# Patient Record
Sex: Male | Born: 1945 | Race: White | Hispanic: No | Marital: Married | Smoking: Former smoker
Health system: Southern US, Community
[De-identification: ages and names within clinical notes are randomized; demographics above are authoritative.]

## PROBLEM LIST (undated history)

## (undated) DIAGNOSIS — I639 Cerebral infarction, unspecified: Secondary | ICD-10-CM

## (undated) DIAGNOSIS — I2510 Atherosclerotic heart disease of native coronary artery without angina pectoris: Secondary | ICD-10-CM

## (undated) DIAGNOSIS — E119 Type 2 diabetes mellitus without complications: Secondary | ICD-10-CM

## (undated) DIAGNOSIS — I10 Essential (primary) hypertension: Secondary | ICD-10-CM

## (undated) DIAGNOSIS — E785 Hyperlipidemia, unspecified: Secondary | ICD-10-CM

## (undated) HISTORY — DX: Essential (primary) hypertension: I10

## (undated) HISTORY — DX: Atherosclerotic heart disease of native coronary artery without angina pectoris: I25.10

## (undated) HISTORY — DX: Hyperlipidemia, unspecified: E78.5

## (undated) HISTORY — PX: CORONARY ANGIOPLASTY WITH STENT PLACEMENT: SHX49

## (undated) HISTORY — DX: Type 2 diabetes mellitus without complications: E11.9

---

## 2001-10-24 ENCOUNTER — Ambulatory Visit (HOSPITAL_COMMUNITY): Admission: RE | Admit: 2001-10-24 | Discharge: 2001-10-25 | Payer: Self-pay | Admitting: Cardiology

## 2008-08-22 DIAGNOSIS — I10 Essential (primary) hypertension: Secondary | ICD-10-CM | POA: Insufficient documentation

## 2008-08-22 DIAGNOSIS — E785 Hyperlipidemia, unspecified: Secondary | ICD-10-CM

## 2008-08-22 DIAGNOSIS — E119 Type 2 diabetes mellitus without complications: Secondary | ICD-10-CM | POA: Insufficient documentation

## 2008-08-22 DIAGNOSIS — I2510 Atherosclerotic heart disease of native coronary artery without angina pectoris: Secondary | ICD-10-CM | POA: Insufficient documentation

## 2008-08-26 ENCOUNTER — Ambulatory Visit: Payer: Self-pay | Admitting: Cardiology

## 2008-08-26 DIAGNOSIS — R072 Precordial pain: Secondary | ICD-10-CM

## 2008-08-26 DIAGNOSIS — N529 Male erectile dysfunction, unspecified: Secondary | ICD-10-CM

## 2008-09-04 ENCOUNTER — Telehealth (INDEPENDENT_AMBULATORY_CARE_PROVIDER_SITE_OTHER): Payer: Self-pay

## 2008-09-05 ENCOUNTER — Encounter: Payer: Self-pay | Admitting: Cardiology

## 2008-09-05 ENCOUNTER — Ambulatory Visit: Payer: Self-pay

## 2008-09-09 ENCOUNTER — Ambulatory Visit: Payer: Self-pay | Admitting: Cardiology

## 2008-09-09 ENCOUNTER — Encounter: Payer: Self-pay | Admitting: Cardiology

## 2008-09-09 ENCOUNTER — Ambulatory Visit: Payer: Self-pay

## 2008-09-10 LAB — CONVERTED CEMR LAB
BUN: 15 mg/dL (ref 6–23)
CO2: 30 meq/L (ref 19–32)
GFR calc non Af Amer: 80.45 mL/min (ref >60)
Glucose, Bld: 82 mg/dL (ref 70–99)
Potassium: 3.9 meq/L (ref 3.5–5.1)
Sodium: 144 meq/L (ref 135–145)

## 2008-10-28 ENCOUNTER — Ambulatory Visit: Payer: Self-pay | Admitting: Cardiology

## 2008-10-28 DIAGNOSIS — I428 Other cardiomyopathies: Secondary | ICD-10-CM | POA: Insufficient documentation

## 2008-10-28 DIAGNOSIS — R05 Cough: Secondary | ICD-10-CM

## 2009-08-21 ENCOUNTER — Ambulatory Visit: Payer: Self-pay | Admitting: Cardiology

## 2009-09-16 ENCOUNTER — Telehealth (INDEPENDENT_AMBULATORY_CARE_PROVIDER_SITE_OTHER): Payer: Self-pay | Admitting: *Deleted

## 2009-10-14 ENCOUNTER — Encounter (INDEPENDENT_AMBULATORY_CARE_PROVIDER_SITE_OTHER): Payer: Self-pay | Admitting: *Deleted

## 2010-02-06 ENCOUNTER — Telehealth (INDEPENDENT_AMBULATORY_CARE_PROVIDER_SITE_OTHER): Payer: Self-pay | Admitting: *Deleted

## 2010-03-31 NOTE — Assessment & Plan Note (Signed)
Summary: PER NP CALL FROM [facility]-   Visit Type:  Follow-up Primary Provider:  Rudie Meyer NP  CC:  Cardiomyopathy.  History of Present Illness: The patient is added to the schedule to evaluate episodes of lightheadedness. He saw his company physician last week and was complaining of some lightheadedness. He does describe orthostatic symptomswith lightheadedness if he stands quickly but no syncope. Last week while being examined he was told he had some "irregularities". I don't know of any documented dysrhythmias and he was not feeling palpitations. However, while at work he did feel lightheaded and things "spinning". He had his blood pressure checked soon after reporting these symptoms but says it was "normal". He has not had any shortness of breath, PND or orthopnea. He has not had chest pressure, neck or arm discomfort. He has not had weight gain or edema.  Current Medications (verified): 1)  Metformin Hcl 1000 Mg Tabs (Metformin Hcl) .Marland Kitchen.. 1 By Mouth Daily 2)  Glipizide 10 Mg Tabs (Glipizide) .... Daily 3)  Tricor 145 Mg Tabs (Fenofibrate) .... Daily 4)  Lipitor 40 Mg Tabs (Atorvastatin Calcium) .... Take One Tablet By Mouth Daily. 5)  Indomethacin Cr 75 Mg Cr-Caps (Indomethacin) .... As Needed 6)  Omeprazole 20 Mg Cpdr (Omeprazole) .... Daily 7)  Aspirin 325 Mg  Tabs (Aspirin) .... Daily 8)  Metoprolol Succinate 50 Mg Xr24h-Tab (Metoprolol Succinate) .... Take One Tablet By Mouth Daily 9)  Cozaar 25 Mg Tabs (Losartan Potassium) .... One By Mouth Once Daily 10)  Androderm 5 Mg/24hr Pt24 (Testosterone) .... As Directed 11)  Vitamin D (Ergocalciferol) 50000 Unit Caps (Ergocalciferol) .Marland Kitchen.. 1 By Mouth Weekly 12)  Cyanocobalamin 1000 Mcg/ml Soln (Cyanocobalamin) .... Monthly  Allergies (verified): No Known Drug Allergies  Past History:  Past Medical History: DM (ICD-250.00) x 8 years HYPERTENSION (ICD-401.9) HYPERLIPIDEMIA (ICD-272.4) x years CAD (ICD-414.00) (Cypher  stenting to the Circ and occluded RCA 2003) Cardiomyopathy (EF 45%)    Past Surgical History: Reviewed history from 08/22/2008 and no changes required. NONE  Review of Systems       As stated in the HPI and negative for all other systems.   Vital Signs:  Patient profile:   65 year old male Height:      69 inches Weight:      169 pounds BMI:     25.05 Pulse rate:   102 / minute Pulse (ortho):   123 / minute Resp:     18 per minute BP sitting:   148 / 96  (right arm) BP standing:   124 / 83  Vitals Entered By: Marrion Coy, CNA (August 21, 2009 2:30 PM)  Serial Vital Signs/Assessments:  Time      Position  BP       Pulse  Resp  Temp     By           Lying RA  135/85   100                   Marrion Coy, CNA           Sitting   130/87   113                   [address] CNA           Standing  124/83   123                   [address], CNA 2 [address]  Standing  130/83   112                   [address], CNA 5 Min     Standing  133/81   114                   [address], CNA  Comments: Lying- No complaints Sitting- No complaints Standing-Dizzy  By: Marrion Coy, CNA  2 Min 2 Min Standing- No complaints By: Marrion Coy, CNA  5 Min 5 Min standing- No complaints By: Marrion Coy, CNA    Physical Exam  General:  Well developed, well nourished, in no acute distress. Head:  normocephalic and atraumatic Eyes:  PERRLA/EOM intact; conjunctiva and lids normal. Mouth:  Teeth, gums and palate normal. Oral mucosa normal. Neck:  Neck supple, no JVD. No masses, thyromegaly or abnormal cervical nodes. Chest Wall:  no deformities or breast masses noted Lungs:  Clear bilaterally to auscultation and percussion. Abdomen:  Bowel sounds positive; abdomen soft and non-tender without masses, organomegaly, or hernias noted. No hepatosplenomegaly. Msk:  Back normal, normal gait. Muscle strength and tone normal. Extremities:  No clubbing or cyanosis. Neurologic:  Alert and  oriented x 3. Skin:  Intact without lesions or rashes. Cervical Nodes:  no significant adenopathy Inguinal Nodes:  no significant adenopathy Psych:  Normal affect.   Detailed Cardiovascular Exam  Neck    Carotids: Carotids full and equal bilaterally without bruits.      Neck Veins: Normal, no JVD.    Heart    Inspection: no deformities or lifts noted.      Palpation: normal PMI with no thrills palpable.      Auscultation: regular rate and rhythm, S1, S2 without murmurs, rubs, gallops, or clicks.    Vascular    Abdominal Aorta: no palpable masses, pulsations, or audible bruits.      Femoral Pulses: normal femoral pulses bilaterally.      Pedal Pulses: normal pedal pulses bilaterally.      Radial Pulses: normal radial pulses bilaterally.      Peripheral Circulation: no clubbing, cyanosis, or edema noted with normal capillary refill.     New Orders:     1)  EKG w/ Interpretation (93000)     2)  Echocardiogram (Echo)   EKG  Procedure date:  08/21/2009  Findings:      sinus tachycardic rate 102, QT prolonged, no change from previous  Impression & Recommendations:  Problem # 1:  CARDIOMYOPATHY (ICD-425.4) The patient continues to have a rapid heart rate. This is exacerbated by standing. I gave him a prescription for compression stockings as I think some of this is related to orthostatic shift. I am going to reevaluate him with an echocardiogram to make sure he's had no decline in his ejection fraction. Further imaging to decide the etiology of this may be indicated perhaps with MRI. Orders: EKG w/ Interpretation (93000) Echocardiogram (Echo)  Problem # 2:  HYPERTENSION (ICD-401.9) His blood pressure remains slightly elevated. However, I would not like to increase his medicines at this point with his symptoms of dizziness and his increased heart rate upon standing.  Patient Instructions: 1)  Your physician recommends that you schedule a follow-up appointment in: 6 weeks in  [place] 2)  Your physician recommends that you continue on your current medications as directed. Please refer to the Current Medication list given to you today. 3)  Wear compression stockings as ordered 4)  Your physician has requested that you  have an echocardiogram.  Echocardiography is a painless test that uses sound waves to create images of your heart. It provides your doctor with information about the size and shape of your heart and how well your heart's chambers and valves are working.  This procedure takes approximately one hour. There are no restrictions for this procedure.

## 2010-03-31 NOTE — Letter (Signed)
Summary: Appointment - Missed  [facility], Main Office  [address]   [place], [place] [address]   Phone: [phone]  Fax: [phone]     October 14, 2009 MRN: [id number]   DAJAUN GOLDRING [address] [place], [place]  [address]   Dear Mr. OKI,  Our records indicate you missed your appointment on    10-08-2009   with  Dr. Antoine Poche It is very important that we reach you to reschedule this appointment. We look forward to participating in your health care needs. Please contact us at the number listed above at your earliest convenience to reschedule this appointment.     Sincerely,     Lorne Skeens  [facility] Scheduling Team

## 2010-03-31 NOTE — Letter (Signed)
Summary: [facility], Main Office  [address]   [place], [place] [address]   Phone: [phone]  Fax: [phone]         August 21, 2009    Timothy Warner   The above named patient had a medical visit today 08/21/2009  Please take this into consideration when reviewing the time away from work/school.          Sincerely yours,  Architectural technologist

## 2010-03-31 NOTE — Progress Notes (Signed)
Summary: Cancelled Echo  Pt cancelled 2D echo  ---- Converted from flag ---- ---- 08/22/2009 1:55 PM, Charolotte Capuchin, RN wrote:   ---- 08/21/2009 4:08 PM, Omar Person wrote: 09-11-09 @ 10:30AM Omar Person  August 21, 2009 4:07 PM  ---- 08/21/2009 3:48 PM, Charolotte Capuchin, RN wrote: The following orders have been entered for this patient and placed on Admin Hold:  Type:     Referral       Code:   Echo Description:   Echocardiogram Order Date:   08/21/2009   Authorized By:   Rollene Rotunda, MD, [facility] Order #:   [id number] Clinical Notes:   appt date:    Chest Pain-786.50,  CHF-428.0, Murmur-785.2,  CVA 434.91, SOB-786.05,   TIA-435.9, Dyspnea-786.09, MV Valve Disease-424.0  AO Valve Disease-424.1 ------------------------------

## 2010-03-31 NOTE — Progress Notes (Signed)
  Dept of [facility] request received sent to [facility]  February 06, 2010 8:36 AM

## 2010-07-17 NOTE — Discharge Summary (Signed)
NAME:  AZAD, CALAME NO.:  [id number]   MEDICAL RECORD NO.:  [id number]                   PATIENT TYPE:  OIB   LOCATION:  6531                                 FACILITY:  [facility]   PHYSICIAN:  Rollene Rotunda, M.D. [facility]            DATE OF BIRTH:  1945/05/15   DATE OF ADMISSION:  10/24/2001  DATE OF DISCHARGE:  10/25/2001                           DISCHARGE SUMMARY - REFERRING   SUMMARY OF HISTORY:  The patient is a 65 year old white male who was  referred for chest discomfort and abnormal stress test.  The patient states  he is very active and he works in a plant as an Personnel officer and is busy  walking and moving around without any problems.  However, when he mows his  yard, mainly with a riding tractor, however, sometimes with a push mower  when he does the trim, he states when he gets to peak activity, he develops  a chest tightness, which will ease off within a few minutes of rest; this  has been going on for several years.  He has not noted any change.  He also  has a two-year diagnosis of non-insulin-dependent diabetes, hyperlipidemia.  His Lipitor was discontinued about a month ago.  He also is noted to have a  early family history and remote tobacco use.  A stress Cardiolite was  performed in our office.  He walked for a total of 6 minutes and 44 seconds,  utilizing the Bruce protocol.  The test was discontinued secondary to chest  discomfort and fatigue.  Maximum blood pressure was 190/80, two minutes into  recovery.  He did have inferolateral ST segment depression which persisted  and eventually resolved in recovery.  Imaging showed a moderate inferior  defect, EF was 49%, thus he was admitted for cardiac catheterization.   LABORATORY AND ACCESSORY CLINICAL DATA:  Preadmission PT was 11.3, PTT was  20.9.  Sodium was 140, potassium 4.5, glucose 143, BUN 17, creatinine 0.9.  H&H 14.4 and 42.1, normal indices, platelets 182,000, WBC is 8.3.   EKG:   Normal sinus rhythm.   On October 24, 2001, H&H were 12.7 and 36.8, normal indices, platelets  173,000, WBC of 6.3.  Post-procedure CK-MB was negative.   HOSPITAL COURSE:  The patient underwent day cardiac catheterization by Dr.  Rollene Rotunda.  According to the progress note, his LV pressure was  elevated at 160/23, aorta 161/80.  Due to the luminal irregularities in the  left main, there is a proximal mid long 25-30% lesion in the LAD and a small  diagonal with an ostial 90% lesion.  At the circumflex A-V groove, he had a  95% ulcerated plaque.  He had a 25% long lesion post this.  The RCA was  dominant with a 99-100% proximal occlusion.  He had left-to-right  collaterals, indicating that this was an old lesion.  His EF was 60% with  mild inferobasilar akinesis.  After reviewing with Dr. Juanda Chance, angioplasty  stenting was performed to the circumflex utilizing Cypher stents, reducing  the 90% lesion to 10% with two Cypher stents.  Post sheath removal and  bedrest, he was ambulating without difficulty and on the morning of October 25, 2001, it was felt that he could be discharged.  It is noted that  research placed him in the [facility].   DISCHARGE DIAGNOSIS:  Unstable angina with a positive stress Cardiolite as  previously described.  Cardiac risk factors include non-insulin-dependent  diabetes, remote tobacco, early family history, hyperlipidemia and  hypertension.   DISPOSITION:  He was discharged home.   DISCHARGE MEDICATIONS:  He was enrolled in the [place].  He is asked to  take this medication, three tablets q.d.  He was asked to resume his:  1. Glucophage 1000 mg b.i.d. on Friday.  2. Glucotrol XL 10 mg q.d.  3. Indocin 500 mg b.i.d.  4. Altace 5 mg q.d.  5. Zocor at the previous dosage q.h.s.  6. Sublingual nitroglycerin as needed.  7. Coated aspirin 325 mg q.d.   ACTIVITY:  He is advised no lifting, driving, sexual activity or heavy  exertion for two days.  He  will return to work Monday, September 1st.   DIET:  Maintain low-salt/-fat/-cholesterol ADA diet.   SPECIAL DISCHARGE INSTRUCTIONS:  If he had any problems with his  catheterization site, he was asked to call us immediately.  Research study  protocol asked him not to take any proton pump inhibitors while taking the  Jumbo Study drug.  If he gets something for his stomach, he needs to take an  H2 blocker two hours after the Jumbo Study drug.  He will need fasting  lipids and LFTs in approximately six weeks, since the Zocor was started  within the last week or so.   FOLLOWUP:  He will see Dr. Sherol Dade. Ross's P.A. in the office on September  8th at 9:15 a.m.     Joellyn Rued, P.A.C.                      Rollene Rotunda, M.D. [facility]    EW/MEDQ  D:  10/25/2001  T:  10/27/2001  Job:  [id number]   cc:   Paulita Cradle, N.P. [facility]   [employer]. Slotnick, M.D.

## 2010-07-17 NOTE — Cardiovascular Report (Signed)
NAME:  Timothy Warner, Timothy Warner NO.:  [id number]   MEDICAL RECORD NO.:  [id number]                   PATIENT TYPE:  OIB   LOCATION:  2899                                 FACILITY:  [facility]   PHYSICIAN:  Rollene Rotunda, M.D. [facility]            DATE OF BIRTH:  [birth year]   DATE OF PROCEDURE:  10/24/2001  DATE OF DISCHARGE:                              CARDIAC CATHETERIZATION   PRIMARY CARE PHYSICIAN:  Birdena Jubilee, N.P., [facility].   CARDIOLOGIST:  Pricilla Riffle, M.D.   PROCEDURE:  Left heart catheterization/coronary arteriography.   INDICATIONS FOR PROCEDURE:  Evaluate patient with chest pain and an abnormal  Cardiolite suggesting inferior infarct and ischemia.   PROCEDURAL NOTE:  Left heart catheterization was performed via the right  femoral artery.  The artery was cannulated using an anterior wall puncture.  A #6 French arterial sheath was inserted via the modified Seldinger  technique.  A preformed Judkins and a pigtail catheter were utilized.  The  patient tolerated the procedure well and left the lab in stable condition.   RESULTS:  1. Hemodynamics     A. LV:  160/23.     B. AO:  161/80.  2. Coronaries     A. The left main had luminal irregularities.     B. The LAD had mild calcification through the mid and proximal segment.        There was a long 25-30% mid to proximal stenosis.  A small first        diagonal had ostial 90% stenosis.  The circumflex in the AV groove had        a 95% ulcerated plaque followed by a long 25% stenosis into a        branching mid obtuse marginal.     C. The right coronary artery was a dominant vessel.  There was 99%        stenosis proximally followed by total occlusion.  There were left-to-        PDA collaterals noted.     D. Left ventriculogram:  A left ventriculogram was obtained in the RAO        projection.  EF was 60% with a mild inferobasilar akinesis.   CONCLUSION:  Severe two-vessel  coronary artery disease.   PLAN:  The patient will have percutaneous revascularization of the  circumflex lesion.  He will continue to have aggressive secondary risk  factor modification and medical management.                                               Rollene Rotunda, M.D. [facility]   JH/MEDQ  D:  10/24/2001  T:  10/24/2001  Job:  [id number]   cc:   Pricilla Riffle, M.D.  [facility]

## 2010-07-17 NOTE — Cardiovascular Report (Signed)
NAME:  Timothy Warner, Timothy Warner NO.:  [id number]   MEDICAL RECORD NO.:  [id number]                   PATIENT TYPE:  OIB   LOCATION:  2899                                 FACILITY:  [facility]   PHYSICIAN:  Everardo Beals. Juanda Chance, M.D. [facility]           DATE OF BIRTH:  12-31-1945   DATE OF PROCEDURE:  10/24/2001  DATE OF DISCHARGE:                              CARDIAC CATHETERIZATION   PROCEDURE PERFORMED:  Percutaneous coronary intervention.   CLINICAL HISTORY:  The patient is 65 years old and has risk factor for  coronary disease and had developed chest pain with severe exertion. He was  seen in consultation by Dr. Dietrich Pates, who ordered a Cardiolite scan which  showed inferior scar without definite ischemia. She recommended evaluation  with catheterization which was performed by Dr. Antoine Poche today.  This showed  a totally occluded right and a large radiolucent area in the mid circumflex  artery that appeared to be a plaque or thrombosis and compromised the lumen  to perhaps 90%. We made a decision to proceed with intervention.   DESCRIPTION OF PROCEDURE:  The procedure was performed via the right femoral  artery using a 7 French Voda 3.5 guiding catheter with side holes. The  patient was given weight-adjusted heparin to perform the ACT to greater than  200 seconds and was given double bolus Integrilin and infusion. He was  enrolled in the JUMBO trial and randomized to either Plavix or JUMBO study  drug. We were able to cross the lesion in the mid circumflex artery with a  luge wire without too much difficulty. We attempted to cross with a 3.0 x 23  mm Cypher stent, but were unable to pass this across the lesion.  We  exchanged for an extra-support wire, but were still unable to pass the  stent.  We then pre-dilated with a 3.25 x 20 mm Quantum Maverick performing  two inflations up to 8 atmospheres for 30 seconds.  We then were able to  pass the Cypher stent and we  deployed this with one inflation of 12  atmospheres for 59 seconds. We then post-dilated with a 3.25 x 20 mm Quantum  Maverick performing one inflation of 12 atmospheres for 28 seconds in the  proximal portion of the stent.  Following this, we realized that there was a  lucency at the distal edge of the stent, as well as some lucency within the  stent.  We were not 100% certain whether this was thrombosis or artifact due  to calcium in the mid stent and we were not sure whether it was thrombosis  or edge tear at the distal stent.  We elected to place a second 3.0 x 8 mm  Cypher stent at the distal edge of the stent and deployed this with one  inflation of 13 atmospheres for 42 seconds. We then dilated again in the  midportion of  the first Cypher stent.  Repeat diagnostic studies were then  performed through the guiding catheter. The patient tolerated the procedure  well and left the laboratory in satisfactory condition.   RESULTS:  Initially the stenosis in the mid circumflex artery was estimated  at 90% with a filling defect.  Following stenting this improved to 10%.  There was no residual dissection seen.    CONCLUSIONS:  Successful placement of tandem overlying Cypher stents in the  mid circumflex artery and improvement in percent diameter narrowing from 80%  to 10%.   DISPOSITION:  The patient was returned to the postangioplasty unit for  further observation.                                                   Bruce Elvera Lennox Juanda Chance, M.D. [facility]    BRB/MEDQ  D:  10/24/2001  T:  10/25/2001  Job:  [id number]   cc:   Paulita Cradle, NP   Pricilla Riffle, M.D. [facility]   Rollene Rotunda, M.D. [facility]   Cardiopulmonary Laboratory

## 2010-08-24 ENCOUNTER — Encounter: Payer: Self-pay | Admitting: Cardiology

## 2011-02-07 ENCOUNTER — Emergency Department (HOSPITAL_COMMUNITY): Payer: Non-veteran care

## 2011-02-07 ENCOUNTER — Other Ambulatory Visit: Payer: Self-pay

## 2011-02-07 ENCOUNTER — Encounter (HOSPITAL_COMMUNITY): Payer: Self-pay | Admitting: *Deleted

## 2011-02-07 ENCOUNTER — Inpatient Hospital Stay (HOSPITAL_COMMUNITY)
Admission: EM | Admit: 2011-02-07 | Discharge: 2011-02-09 | DRG: 065 | Disposition: A | Discharge status: Home / Self Care | Payer: Non-veteran care | Source: Ambulatory Visit | Attending: Internal Medicine | Admitting: Internal Medicine

## 2011-02-07 DIAGNOSIS — I2510 Atherosclerotic heart disease of native coronary artery without angina pectoris: Secondary | ICD-10-CM

## 2011-02-07 DIAGNOSIS — I428 Other cardiomyopathies: Secondary | ICD-10-CM

## 2011-02-07 DIAGNOSIS — I6509 Occlusion and stenosis of unspecified vertebral artery: Secondary | ICD-10-CM | POA: Diagnosis present

## 2011-02-07 DIAGNOSIS — R4789 Other speech disturbances: Secondary | ICD-10-CM | POA: Diagnosis present

## 2011-02-07 DIAGNOSIS — R29810 Facial weakness: Secondary | ICD-10-CM | POA: Diagnosis present

## 2011-02-07 DIAGNOSIS — Z7982 Long term (current) use of aspirin: Secondary | ICD-10-CM

## 2011-02-07 DIAGNOSIS — N529 Male erectile dysfunction, unspecified: Secondary | ICD-10-CM

## 2011-02-07 DIAGNOSIS — E785 Hyperlipidemia, unspecified: Secondary | ICD-10-CM

## 2011-02-07 DIAGNOSIS — R471 Dysarthria and anarthria: Secondary | ICD-10-CM | POA: Diagnosis present

## 2011-02-07 DIAGNOSIS — I10 Essential (primary) hypertension: Secondary | ICD-10-CM

## 2011-02-07 DIAGNOSIS — IMO0002 Reserved for concepts with insufficient information to code with codable children: Secondary | ICD-10-CM

## 2011-02-07 DIAGNOSIS — E1165 Type 2 diabetes mellitus with hyperglycemia: Secondary | ICD-10-CM

## 2011-02-07 DIAGNOSIS — R4701 Aphasia: Secondary | ICD-10-CM | POA: Diagnosis present

## 2011-02-07 DIAGNOSIS — I6350 Cerebral infarction due to unspecified occlusion or stenosis of unspecified cerebral artery: Principal | ICD-10-CM | POA: Diagnosis present

## 2011-02-07 DIAGNOSIS — I639 Cerebral infarction, unspecified: Secondary | ICD-10-CM

## 2011-02-07 DIAGNOSIS — R918 Other nonspecific abnormal finding of lung field: Secondary | ICD-10-CM | POA: Diagnosis present

## 2011-02-07 DIAGNOSIS — Z8249 Family history of ischemic heart disease and other diseases of the circulatory system: Secondary | ICD-10-CM

## 2011-02-07 DIAGNOSIS — E119 Type 2 diabetes mellitus without complications: Secondary | ICD-10-CM

## 2011-02-07 LAB — CBC
HCT: 37.2 % — ABNORMAL LOW (ref 39.0–52.0)
Hemoglobin: 12.3 g/dL — ABNORMAL LOW (ref 13.0–17.0)
MCH: 27.4 pg (ref 26.0–34.0)
MCHC: 33.1 g/dL (ref 30.0–36.0)
MCV: 82.9 fL (ref 78.0–100.0)
Platelets: 207 10*3/uL (ref 150–400)
RBC: 4.49 MIL/uL (ref 4.22–5.81)
RDW: 12.7 % (ref 11.5–15.5)
WBC: 8.1 10*3/uL (ref 4.0–10.5)

## 2011-02-07 LAB — BASIC METABOLIC PANEL
BUN: 11 mg/dL (ref 6–23)
CO2: 26 mEq/L (ref 19–32)
Calcium: 9.3 mg/dL (ref 8.4–10.5)
Chloride: 98 mEq/L (ref 96–112)
Creatinine, Ser: 0.85 mg/dL (ref 0.50–1.35)
GFR calc Af Amer: >90 mL/min (ref >90)
GFR calc non Af Amer: >90 mL/min (ref >90)
Glucose, Bld: 304 mg/dL — ABNORMAL HIGH (ref 70–99)
Potassium: 4.1 mEq/L (ref 3.5–5.1)
Sodium: 135 mEq/L (ref 135–145)

## 2011-02-07 LAB — URINALYSIS, ROUTINE W REFLEX MICROSCOPIC
Glucose, UA: >1000 mg/dL — AB
Hgb urine dipstick: NEGATIVE
Ketones, ur: NEGATIVE mg/dL
Leukocytes, UA: NEGATIVE
Nitrite: NEGATIVE
Protein, ur: NEGATIVE mg/dL
Specific Gravity, Urine: 1.03 (ref 1.005–1.030)
Urobilinogen, UA: 1 mg/dL (ref 0.0–1.0)
pH: 5 (ref 5.0–8.0)

## 2011-02-07 LAB — GLUCOSE, CAPILLARY: Glucose-Capillary: 331 mg/dL — ABNORMAL HIGH (ref 70–99)

## 2011-02-07 LAB — URINE MICROSCOPIC-ADD ON

## 2011-02-07 MED ORDER — ASPIRIN 300 MG RE SUPP
300.0000 mg | Freq: Every day | RECTAL | Status: DC
  Filled 2011-02-07 (×2): qty 1

## 2011-02-07 MED ORDER — SODIUM CHLORIDE 0.9 % IV SOLN: INTRAVENOUS | Status: DC

## 2011-02-07 MED ORDER — INSULIN ASPART 100 UNIT/ML ~~LOC~~ SOLN
0.0000 [IU] | Freq: Every day | SUBCUTANEOUS | Status: DC
  Administered 2011-02-08: 2 [IU] via SUBCUTANEOUS
  Administered 2011-02-08: 4 [IU] via SUBCUTANEOUS
  Filled 2011-02-07: qty 3

## 2011-02-07 MED ORDER — INSULIN ASPART 100 UNIT/ML ~~LOC~~ SOLN
0.0000 [IU] | Freq: Three times a day (TID) | SUBCUTANEOUS | Status: DC
  Administered 2011-02-08 (×3): 5 [IU] via SUBCUTANEOUS
  Administered 2011-02-09: 8 [IU] via SUBCUTANEOUS
  Administered 2011-02-09: 3 [IU] via SUBCUTANEOUS

## 2011-02-07 MED ORDER — GADOBENATE DIMEGLUMINE 529 MG/ML IV SOLN
15.0000 mL | Freq: Once | INTRAVENOUS | Status: AC | PRN
  Administered 2011-02-07: 15 mL via INTRAVENOUS

## 2011-02-07 MED ORDER — ASPIRIN 325 MG PO TABS
325.0000 mg | ORAL_TABLET | Freq: Every day | ORAL | Status: DC
  Administered 2011-02-07: 325 mg via ORAL
  Filled 2011-02-07 (×2): qty 1

## 2011-02-07 MED ORDER — SENNOSIDES-DOCUSATE SODIUM 8.6-50 MG PO TABS
1.0000 | ORAL_TABLET | Freq: Every evening | ORAL | Status: DC | PRN
  Filled 2011-02-07: qty 1

## 2011-02-07 MED ORDER — ENOXAPARIN SODIUM 40 MG/0.4ML ~~LOC~~ SOLN
40.0000 mg | Freq: Every day | SUBCUTANEOUS | Status: DC
  Administered 2011-02-07 – 2011-02-08 (×2): 40 mg via SUBCUTANEOUS
  Filled 2011-02-07 (×3): qty 0.4

## 2011-02-07 NOTE — ED Notes (Signed)
Pt aware of plans to admit

## 2011-02-07 NOTE — ED Notes (Signed)
Pt to mri and wife aware will take 30 minutes to 1 hour per transporter.

## 2011-02-07 NOTE — ED Notes (Signed)
Pt provided with sack lunch and water per PA. No distress noted. Family at bedside.

## 2011-02-07 NOTE — ED Notes (Signed)
Pt reports last known normal was last night at 2300. Woke up at 0300 this am and had slurred speech and facial numbness, but went back to bed. At triage, has +facial droop and slurred speech, grips are equal, no arm drift noted.

## 2011-02-07 NOTE — ED Provider Notes (Signed)
History     CSN: [id number] Arrival date & time: 02/07/2011 12:39 PM   First MD Initiated Contact with Patient 02/07/11 1252      Chief Complaint  Patient presents with  . Aphasia  . Facial Droop    (Consider location/radiation/quality/duration/timing/severity/associated sxs/prior treatment) HPI Patient states that he went to bed last night at 11:00 with no symptoms and woke up at 3:30 and noted that his face felt funny and he was talking funny.  He states he went back to bed and woke up this morning can and continue to have slurring of his speech and trouble finding his words.  Patient states his mouth feels droopy mainly on the left side.  Patient states he has had no weakness in his arms or legs.  Patient states that he has no chest pain shortness of breath, vomiting, nausea, diarrhea, abdominal pain, or headache.  Past Medical History  Diagnosis Date  . DM (diabetes mellitus)     x8 years  . HTN (hypertension)   . HLD (hyperlipidemia)   . CAD (coronary artery disease)     cypher stenting to the circ and occluded RCA 2003.   . Cardiomyopathy     EF 45%    Past Surgical History  Procedure Date  . Coronary angioplasty with stent placement     Family History  Problem Relation Age of Onset  . Stroke      family hx  . Diabetes      family hx  . Hypertension      family hx     History  Substance Use Topics  . Smoking status: Former Games developer  . Smokeless tobacco: Not on file  . Alcohol Use: No      Review of Systems All pertinent positives and negatives in the history of present illness   Allergies  Review of patient's allergies indicates no known allergies.  Home Medications   Current Outpatient Rx  Name Route Sig Dispense Refill  . ASPIRIN 325 MG PO TABS Oral Take 325 mg by mouth daily.      . ATORVASTATIN CALCIUM 80 MG PO TABS Oral Take 40 mg by mouth daily.      Marland Kitchen GLIPIZIDE 10 MG PO TABS Oral Take 10 mg by mouth daily.      Marland Kitchen METFORMIN HCL 1000 MG PO  TABS Oral Take 1,000 mg by mouth 2 (two) times daily with a meal.     . PAROXETINE HCL 40 MG PO TABS Oral Take 40 mg by mouth every morning.      Marland Kitchen VITAMIN B-1 100 MG PO TABS Oral Take 100 mg by mouth daily.      . ERGOCALCIFEROL 50000 UNITS PO CAPS Oral Take 50,000 Units by mouth once a week.      . FENOFIBRATE 145 MG PO TABS Oral Take 145 mg by mouth daily.      . INDOMETHACIN ER 75 MG PO CPCR Oral Take 75 mg by mouth as needed.      Marland Kitchen LOSARTAN POTASSIUM 25 MG PO TABS Oral Take 25 mg by mouth daily.      Marland Kitchen METOPROLOL SUCCINATE ER 50 MG PO TB24 Oral Take 50 mg by mouth daily.      Marland Kitchen OMEPRAZOLE 20 MG PO CPDR Oral Take 20 mg by mouth daily.      . TESTOSTERONE 5 MG/24HR TD PT24 Transdermal Place 1 patch onto the skin daily. UAD       BP 157/91  Pulse 94  Temp(Src) 97.8 F (36.6 C) (Oral)  Resp 20  SpO2 99%  Physical Exam  Constitutional: He is oriented to person, place, and time. He appears well-developed and well-nourished. No distress.  HENT:  Head: Normocephalic and atraumatic.  Eyes: Pupils are equal, round, and reactive to light.  Cardiovascular: Normal rate, regular rhythm and normal heart sounds.  Exam reveals no gallop and no friction rub.   No murmur heard. Pulmonary/Chest: Effort normal and breath sounds normal.  Neurological: He is alert and oriented to person, place, and time. He has normal strength. No sensory deficit. Coordination and gait normal.       Patient has a mild expressive aphasia and there is marked with slurring of his speech.  The patient has normal strength and coordination in all extremities.  Patient is a mild facial droop on the left.   Skin: Skin is warm and dry. No rash noted.  Psychiatric: He has a normal mood and affect. His behavior is normal. Judgment and thought content normal.    ED Course  Procedures (including critical care time)   Labs Reviewed  CBC  BASIC METABOLIC PANEL  URINALYSIS, ROUTINE W REFLEX MICROSCOPIC    1:28 PM Patient  has slurring of speech and mild expressive aphasia he would be consistent with an infarct.  Awaiting lab tests and CT scan.     MDM    Date: 02/07/2011  Rate: 89  Rhythm: normal sinus rhythm  QRS Axis: normal  Intervals: normal  ST/T Wave abnormalities: normal  Conduction Disutrbances:none  Narrative Interpretation:   Old EKG Reviewed: unchanged         Carlyle Dolly, PA-C 02/07/11 1552  Carlyle Dolly, PA-C 02/07/11 1557

## 2011-02-07 NOTE — ED Notes (Signed)
EKG done on arrival and given to Dr.Knapp. New and Old

## 2011-02-07 NOTE — ED Provider Notes (Signed)
4:30 PM Pt care assumed from PA [facility]. Pt with aphasia and suspected CVA. Symptoms present upon waking this morning. CT scan with no acute findings. Awaiting MRI with plan to call medicine for admission.  7:12 PM I have spoken with the radiology department regarding the delay in reading the patient's MRI. They will call me back.  7:41 PM The patient's MRI will be read this evening and an interpretation will be entered into the imaging study. I have spoken with Dr. Lovell Sheehan with the triad hospitalist regarding admission on this patient. I will place a bed request and holding orders per our discussion.  Elwyn Reach Ellis, [place] 02/07/11 1941

## 2011-02-07 NOTE — ED Provider Notes (Addendum)
Patient states he was fine when he went to bed about 11 PM last night he relates he woke up about 3 AM to go the bathroom and felt like something wasn't right and thought he was having some trouble speaking although he wasn't talking to anybody. He relates when he woke up at 11 AM people were asking him what was wrong with his face and he was having worse problems speaking. He denies any numbness or weakness in his arms or legs.  Patient's noted to have a possible mild right facial droop he is intact in his for head muscles in his tong he is able to move all of his other extremities normally. Patient does appear to have dysarthric, slurred speech  Medical screening examination/treatment/procedure(s) were conducted as a shared visit with non-physician practitioner(s) and myself.  I personally evaluated the patient during the encounter Devoria Albe, MD, Franz Dell, MD 02/07/11 1354  Ward Givens, MD 02/07/11 1354

## 2011-02-07 NOTE — H&P (Addendum)
DATE OF ADMISSION:  02/07/2011  PCP:  [facility] in [place]/ Dr. Sondra Come    Chief Complaint:  Slurred speech  HPI: Timothy Warner is an 65 y.o. male with complaints of slurred speech upon awakening at 3:30 AM.  He denies having any headache, visual changes or weakness, nor has he had chest pain, or SOB.  He denies any ataxia or syncope.  His wife reports that he has had some confusion this week.  He presented to the ED at 12:40 PM beyond the window for TPA intervention.  His symptoms persist at the time of admission.  His CT scan were negative for acute findings, and an MRI was performed revealing an acute non-hemorrhagic infarct of the left lentiform nucleus extending superiorly into the right corona radiata.         Past Medical History  Diagnosis Date  . DM (diabetes mellitus)     x8 years  . HTN (hypertension)   . HLD (hyperlipidemia)   . CAD (coronary artery disease)     cypher stenting to the circ and occluded RCA 2003.   . Cardiomyopathy     EF 45%    Past Surgical History  Procedure Date  . Coronary angioplasty with stent placement     Medications:  HOME MEDS: Prior to Admission medications   Medication Sig Start Date End Date Taking? Authorizing Provider  aspirin 325 MG tablet Take 325 mg by mouth daily.     Yes Historical Provider, MD  atorvastatin (LIPITOR) 80 MG tablet Take 40 mg by mouth daily.     Yes Historical Provider, MD  ergocalciferol (VITAMIN D2) 50000 UNITS capsule Take 50,000 Units by mouth once a week.     Yes Historical Provider, MD  glipiZIDE (GLUCOTROL) 10 MG tablet Take 10 mg by mouth daily.     Yes Historical Provider, MD  metFORMIN (GLUCOPHAGE) 1000 MG tablet Take 1,000 mg by mouth 2 (two) times daily with a meal.    Yes Historical Provider, MD  omeprazole (PRILOSEC) 20 MG capsule Take 20 mg by mouth daily.     Yes Historical Provider, MD  PARoxetine (PAXIL) 40 MG tablet Take 40 mg by mouth every morning.     Yes Historical Provider, MD    thiamine (VITAMIN B-1) 100 MG tablet Take 100 mg by mouth daily.     Yes Historical Provider, MD  fenofibrate (TRICOR) 145 MG tablet Take 145 mg by mouth daily.      Historical Provider, MD  indomethacin (INDOCIN SR) 75 MG CR capsule Take 75 mg by mouth as needed.      Historical Provider, MD    Allergies:  No Known Allergies  Social History:   reports that he has quit smoking. He does not have any smokeless tobacco history on file. He reports that he does not drink alcohol or use illicit drugs.  Family History: Family History  Problem Relation Age of Onset  . Stroke      family hx  . Diabetes      family hx  . Hypertension      family hx     Review of Systems:  The patient denies anorexia, fever, weight loss, vision loss, decreased hearing, hoarseness, chest pain, syncope, dyspnea on exertion, peripheral edema, balance deficits, hemoptysis, abdominal pain, melena, hematochezia, severe indigestion/heartburn, hematuria, incontinence, genital sores, muscle weakness, suspicious skin lesions, transient blindness, difficulty walking, depression, unusual weight change, abnormal bleeding, enlarged lymph nodes, angioedema.    Physical Exam:  GEN: Pleasant  65 year old well nourished and well developed Caucasian male examined  and found to be in no acute distress; cooperative with exam Filed Vitals:   02/07/11 1733 02/07/11 1800 02/07/11 1830 02/07/11 1900  BP: 163/83 169/101 162/89 143/94  Pulse: 68 74 70 69  Temp:      TempSrc:      Resp: 16 15 13 14   SpO2: 96% 97% 96% 98%   Blood pressure 143/94, pulse 69, temperature 97.8 F (36.6 C), temperature source Oral, resp. rate 14, SpO2 98.00%. PSYCH: He is alert and oriented x4; does not appear anxious does not appear depressed; affect is normal HEENT: Normocephalic and Atraumatic, Mucous membranes pink; PERRLA; EOM intact; Fundi:  Benign;  No scleral icterus, Nares: Patent, Oropharynx: Clear, Fair Dentition, Neck:  FROM, no cervical  lymphadenopathy nor thyromegaly or carotid bruit; no JVD; Breasts:: Not examined CHEST WALL: No tenderness CHEST: Normal respiration, clear to auscultation bilaterally HEART: Regular rate and rhythm; no murmurs rubs or gallops BACK: No kyphosis or scoliosis; no CVA tenderness ABDOMEN: Positive Bowel Sounds, soft non-tender; no masses, no organomegaly. Rectal Exam: Not done EXTREMITIES: No bone or joint deformity; age-appropriate arthropathy of the hands and knees; no cyanosis, clubbing or edema; no ulcerations. Genitalia: not examined PULSES: 2+ and symmetric SKIN: Normal hydration no rash or ulceration CNS: Cranial nerves 2-12 grossly intact no focal neurologic deficit   Labs & Imaging Results for orders placed during the hospital encounter of 02/07/11 (from the past 48 hour(s))  CBC     Status: Abnormal   Collection Time   02/07/11  1:15 PM      Component Value Range Comment   WBC 8.1  4.0 - 10.5 (K/uL)    RBC 4.49  4.22 - 5.81 (MIL/uL)    Hemoglobin 12.3 (*) 13.0 - 17.0 (g/dL)    HCT 16.1 (*) 09.6 - 52.0 (%)    MCV 82.9  78.0 - 100.0 (fL)    MCH 27.4  26.0 - 34.0 (pg)    MCHC 33.1  30.0 - 36.0 (g/dL)    RDW 04.5  40.9 - 81.1 (%)    Platelets 207  150 - 400 (K/uL)   BASIC METABOLIC PANEL     Status: Abnormal   Collection Time   02/07/11  1:15 PM      Component Value Range Comment   Sodium 135  135 - 145 (mEq/L)    Potassium 4.1  3.5 - 5.1 (mEq/L)    Chloride 98  96 - 112 (mEq/L)    CO2 26  19 - 32 (mEq/L)    Glucose, Bld 304 (*) 70 - 99 (mg/dL)    BUN 11  6 - 23 (mg/dL)    Creatinine, Ser 9.14  0.50 - 1.35 (mg/dL)    Calcium 9.3  8.4 - 10.5 (mg/dL)    GFR calc non Af Amer >90  >90 (mL/min)    GFR calc Af Amer >90  >90 (mL/min)   URINALYSIS, ROUTINE W REFLEX MICROSCOPIC     Status: Abnormal   Collection Time   02/07/11  2:53 PM      Component Value Range Comment   Color, Urine YELLOW  YELLOW     APPearance CLEAR  CLEAR     Specific Gravity, Urine 1.030  1.005 - 1.030       pH 5.0  5.0 - 8.0     Glucose, UA >1000 (*) NEGATIVE (mg/dL)    Hgb urine dipstick NEGATIVE  NEGATIVE  Bilirubin Urine SMALL (*) NEGATIVE     Ketones, ur NEGATIVE  NEGATIVE (mg/dL)    Protein, ur NEGATIVE  NEGATIVE (mg/dL)    Urobilinogen, UA 1.0  0.0 - 1.0 (mg/dL)    Nitrite NEGATIVE  NEGATIVE     Leukocytes, UA NEGATIVE  NEGATIVE    URINE MICROSCOPIC-ADD ON     Status: Normal   Collection Time   02/07/11  2:53 PM      Component Value Range Comment   Urine-Other MUCOUS PRESENT      Ct Head Wo Contrast  02/07/2011  *RADIOLOGY REPORT*  Clinical Data: Stroke. Aphasia.  Slurred speech and facial droop.  CT HEAD WITHOUT CONTRAST  Technique:  Contiguous axial images were obtained from the base of the skull through the vertex without contrast.  Comparison: None.  Findings: There is no evidence of intracranial hemorrhage, brain edema or other signs of acute cortical infarction.  There is no evidence of intracranial mass lesion or mass effect.  No abnormal extra-axial fluid collections are identified.  Mild diffuse cerebral atrophy and chronic small vessel disease is seen. Old bilateral basal ganglia lacunes are noted.  No evidence of hydrocephalus.  No skull abnormality identified.  IMPRESSION:  1.  No acute intracranial abnormality. 2.  Mild cerebral atrophy, chronic small vessel disease, and old bilateral basal ganglia lacunes.  Original Report Authenticated By: Danae Orleans, M.D.   [name] Wo Contrast  02/07/2011  *RADIOLOGY REPORT*  Clinical Data: Right facial droop and slurred speech.  MRI HEAD WITHOUT AND WITH CONTRAST  Technique:  Multiplanar, multiecho pulse sequences of the brain and surrounding structures were obtained according to standard protocol without and with intravenous contrast  Contrast: 15mL MULTIHANCE GADOBENATE DIMEGLUMINE 529 MG/ML IV SOLN  Comparison: CT head without contrast 02/07/2011.  Findings: An acute non hemorrhagic infarct is present within the left lentiform  nucleus extending superiorly into the corona radiata.  The other remote lacunar infarcts are present within the basal ganglia bilaterally.  Age advanced atrophy and extensive white matter changes are present as well.  No mass lesion is present.  There is abnormal signal within the right vertebral artery, suggesting occlusion.  This may be chronic.  Flow is present in the left vertebral artery and basilar artery.  Flow is present in the anterior circulation.  The paranasal sinuses and mastoid air cells are clear.  The globes and orbits are intact.  The postcontrast images demonstrate no areas of pathologic enhancement.  IMPRESSION:  1.  No acute non hemorrhagic infarct of the left lentiform nucleus with superior extension to the corona radiata. 2.  Multiple other remote lacunar infarcts of the basal ganglia bilaterally. 3.  Age advanced atrophy and extensive white matter disease.  This likely reflects the sequelae of chronic microvascular ischemia. 4.  Abnormal signal in the right vertebral artery, suggesting occlusion.  Original Report Authenticated By: Jamesetta Orleans. MATTERN, M.D.      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  Further risk stratification will be performed, and his regular medications will be adjusted accordingly.  PT/OT/ and Speech evaluations have been requested.  DVT prophylaxis and SSI coverage have been ordered.  Other plans as per orders.  Add Plavix therapy.  Neuro Consult.  CODE STATUS:      FULL CODE      Alane Hanssen C 02/07/2011, 8:35 PM

## 2011-02-08 ENCOUNTER — Inpatient Hospital Stay (HOSPITAL_COMMUNITY): Payer: Non-veteran care

## 2011-02-08 LAB — GLUCOSE, CAPILLARY
Glucose-Capillary: 221 mg/dL — ABNORMAL HIGH (ref 70–99)
Glucose-Capillary: 245 mg/dL — ABNORMAL HIGH (ref 70–99)

## 2011-02-08 LAB — LIPID PANEL
Cholesterol: 149 mg/dL (ref 0–200)
Triglycerides: 231 mg/dL — ABNORMAL HIGH (ref <150)
VLDL: 46 mg/dL — ABNORMAL HIGH (ref 0–40)

## 2011-02-08 LAB — HEMOGLOBIN A1C
Hgb A1c MFr Bld: 9.9 % — ABNORMAL HIGH (ref <5.7)
Mean Plasma Glucose: 237 mg/dL — ABNORMAL HIGH (ref <117)
Mean Plasma Glucose: 237 mg/dL — ABNORMAL HIGH (ref <117)

## 2011-02-08 MED ORDER — CLOPIDOGREL BISULFATE 75 MG PO TABS
300.0000 mg | ORAL_TABLET | ORAL | Status: AC
  Administered 2011-02-08: 300 mg via ORAL
  Filled 2011-02-08: qty 4

## 2011-02-08 MED ORDER — CLOPIDOGREL BISULFATE 75 MG PO TABS
75.0000 mg | ORAL_TABLET | Freq: Every day | ORAL | Status: DC
  Administered 2011-02-09: 75 mg via ORAL
  Filled 2011-02-08: qty 1

## 2011-02-08 MED ORDER — METFORMIN HCL 500 MG PO TABS
500.0000 mg | ORAL_TABLET | Freq: Two times a day (BID) | ORAL | Status: DC
  Administered 2011-02-08 – 2011-02-09 (×2): 500 mg via ORAL
  Filled 2011-02-08 (×4): qty 1

## 2011-02-08 MED ORDER — INSULIN GLARGINE 100 UNIT/ML ~~LOC~~ SOLN
5.0000 [IU] | Freq: Every day | SUBCUTANEOUS | Status: DC
  Administered 2011-02-08: 5 [IU] via SUBCUTANEOUS
  Filled 2011-02-08: qty 3

## 2011-02-08 MED ORDER — MOXIFLOXACIN HCL 400 MG PO TABS
400.0000 mg | ORAL_TABLET | Freq: Every day | ORAL | Status: DC
  Administered 2011-02-08: 400 mg via ORAL
  Filled 2011-02-08 (×2): qty 1

## 2011-02-08 MED ORDER — INSULIN PEN STARTER KIT
1.0000 | Freq: Once | Status: AC
  Administered 2011-02-08: 1
  Filled 2011-02-08: qty 1

## 2011-02-08 NOTE — Progress Notes (Signed)
PRELIMINARY  PRELIMINARY  PRELIMINARY  PRELIMINARY  Carotid Duplex completed.    Preliminary report:  Bilateral: No evidence of hemodynamically significant internal carotid artery stenosis.  Right:  Vertebral artery flow is antegrade.  Left:  Vertebral artery flow is antegrade.  Waveform is spiked with loss of diastolic component suggesting distal occlusion.   Timothy Warner 02/08/2011 1:02 PM

## 2011-02-08 NOTE — Consult Note (Signed)
Reason for Consult: Dysarthria, stroke Referring Physician: Dr Della Goo  Timothy Warner is an 65 y.o. male.   HPI: 65 YO WM with h/o DM, HTN, CAD, CM who notes onset of difficulties speaking when arose from bed around 3AM last night.  Simply went back to bed and when got up around 10:30 AM noted persistent trouble speaking with "twisted face".  Presented to ER where underwent Head CT (WM dz, old bilateral BG lacunes) and Head MRI (acute stroke left lentiform nucleus., along with old bilateral BG lacunes).  Since presentation he notes no significant weakness, numbness, diplopia, or prior similar episodes.    Past Medical History  Diagnosis Date  . DM (diabetes mellitus)     x8 years  . HTN (hypertension)   . HLD (hyperlipidemia)   . CAD (coronary artery disease)     cypher stenting to the circ and occluded RCA 2003.   . Cardiomyopathy     EF 45%    Past Surgical History  Procedure Date  . Coronary angioplasty with stent placement     Family History  Problem Relation Age of Onset  . Stroke      family hx  . Diabetes      family hx  . Hypertension      family hx     Social History:  reports that he has quit smoking. He does not have any smokeless tobacco history on file. He reports that he does not drink alcohol or use illicit drugs.  Allergies: No Known Allergies  Medications:  Scheduled:   . sodium chloride   Intravenous STAT  . aspirin  300 mg Rectal Daily   Or  . aspirin  325 mg Oral Daily  . enoxaparin  40 mg Subcutaneous QHS  . insulin aspart  0-15 Units Subcutaneous TID WC  . insulin aspart  0-5 Units Subcutaneous QHS    Results for orders placed during the hospital encounter of 02/07/11 (from the past 48 hour(s))  CBC     Status: Abnormal   Collection Time   02/07/11  1:15 PM      Component Value Range Comment   WBC 8.1  4.0 - 10.5 (K/uL)    RBC 4.49  4.22 - 5.81 (MIL/uL)    Hemoglobin 12.3 (*) 13.0 - 17.0 (g/dL)    HCT 16.1 (*) 09.6 - 52.0 (%)     MCV 82.9  78.0 - 100.0 (fL)    MCH 27.4  26.0 - 34.0 (pg)    MCHC 33.1  30.0 - 36.0 (g/dL)    RDW 04.5  40.9 - 81.1 (%)    Platelets 207  150 - 400 (K/uL)   BASIC METABOLIC PANEL     Status: Abnormal   Collection Time   02/07/11  1:15 PM      Component Value Range Comment   Sodium 135  135 - 145 (mEq/L)    Potassium 4.1  3.5 - 5.1 (mEq/L)    Chloride 98  96 - 112 (mEq/L)    CO2 26  19 - 32 (mEq/L)    Glucose, Bld 304 (*) 70 - 99 (mg/dL)    BUN 11  6 - 23 (mg/dL)    Creatinine, Ser 9.14  0.50 - 1.35 (mg/dL)    Calcium 9.3  8.4 - 10.5 (mg/dL)    GFR calc non Af Amer >90  >90 (mL/min)    GFR calc Af Amer >90  >90 (mL/min)   URINALYSIS, ROUTINE W REFLEX MICROSCOPIC  Status: Abnormal   Collection Time   02/07/11  2:53 PM      Component Value Range Comment   Color, Urine YELLOW  YELLOW     APPearance CLEAR  CLEAR     Specific Gravity, Urine 1.030  1.005 - 1.030     pH 5.0  5.0 - 8.0     Glucose, UA >1000 (*) NEGATIVE (mg/dL)    Hgb urine dipstick NEGATIVE  NEGATIVE     Bilirubin Urine SMALL (*) NEGATIVE     Ketones, ur NEGATIVE  NEGATIVE (mg/dL)    Protein, ur NEGATIVE  NEGATIVE (mg/dL)    Urobilinogen, UA 1.0  0.0 - 1.0 (mg/dL)    Nitrite NEGATIVE  NEGATIVE     Leukocytes, UA NEGATIVE  NEGATIVE    URINE MICROSCOPIC-ADD ON     Status: Normal   Collection Time   02/07/11  2:53 PM      Component Value Range Comment   Urine-Other MUCOUS PRESENT     GLUCOSE, CAPILLARY     Status: Abnormal   Collection Time   02/07/11  9:12 PM      Component Value Range Comment   Glucose-Capillary 331 (*) 70 - 99 (mg/dL)     Ct Head Wo Contrast  02/07/2011  *RADIOLOGY REPORT*  Clinical Data: Stroke. Aphasia.  Slurred speech and facial droop.  CT HEAD WITHOUT CONTRAST  Technique:  Contiguous axial images were obtained from the base of the skull through the vertex without contrast.  Comparison: None.  Findings: There is no evidence of intracranial hemorrhage, brain edema or other signs of acute  cortical infarction.  There is no evidence of intracranial mass lesion or mass effect.  No abnormal extra-axial fluid collections are identified.  Mild diffuse cerebral atrophy and chronic small vessel disease is seen. Old bilateral basal ganglia lacunes are noted.  No evidence of hydrocephalus.  No skull abnormality identified.  IMPRESSION:  1.  No acute intracranial abnormality. 2.  Mild cerebral atrophy, chronic small vessel disease, and old bilateral basal ganglia lacunes.  Original Report Authenticated By: Danae Orleans, M.D.   [name] Wo Contrast  02/07/2011  *RADIOLOGY REPORT*  Clinical Data: Right facial droop and slurred speech.  MRI HEAD WITHOUT AND WITH CONTRAST  Technique:  Multiplanar, multiecho pulse sequences of the brain and surrounding structures were obtained according to standard protocol without and with intravenous contrast  Contrast: 15mL MULTIHANCE GADOBENATE DIMEGLUMINE 529 MG/ML IV SOLN  Comparison: CT head without contrast 02/07/2011.  Findings: An acute non hemorrhagic infarct is present within the left lentiform nucleus extending superiorly into the corona radiata.  The other remote lacunar infarcts are present within the basal ganglia bilaterally.  Age advanced atrophy and extensive white matter changes are present as well.  No mass lesion is present.  There is abnormal signal within the right vertebral artery, suggesting occlusion.  This may be chronic.  Flow is present in the left vertebral artery and basilar artery.  Flow is present in the anterior circulation.  The paranasal sinuses and mastoid air cells are clear.  The globes and orbits are intact.  The postcontrast images demonstrate no areas of pathologic enhancement.  IMPRESSION:  1.  No acute non hemorrhagic infarct of the left lentiform nucleus with superior extension to the corona radiata. 2.  Multiple other remote lacunar infarcts of the basal ganglia bilaterally. 3.  Age advanced atrophy and extensive white matter  disease.  This likely reflects the sequelae of chronic microvascular ischemia.  4.  Abnormal signal in the right vertebral artery, suggesting occlusion.  Original Report Authenticated By: Jamesetta Orleans. MATTERN, M.D.    Review of Systems: A comprehensive review of systems was negative except for: Neurological: positive for speech problems  Blood pressure 171/90, pulse 76, temperature 97.9 F (36.6 C), temperature source Oral, resp. rate 16, height 5\' 9"  (1.753 m), weight 83.462 kg (184 lb), SpO2 96.00%.  Neurological Exam:  Patient is alert and oriented x 3.  No acute distress.  Fluent, but dysarthric speech.  Able to name, repeat, and follow commands.  EOMI, VFFC, PERRL.  Face symmetric with normal sensation.  Tongue protrudes midline, otherwise CN's II-XII intact. Motor: Normal bulk and tone with 5/5 strength throughout.  Sensation:  Normal pinprick, vibration, and proprioception.  Cerebellar:  Normal finger to nose  No pronator drift.  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  Head MRI confirms an acute left lentiform stroke, which is the likely cause of his clinical presentation.  Other than dysarthria, his neurological exam is non-focal  Recc: 1) Vascular survey (Head MRA, Carotids, Echo) 2) PT, OT, ST 3) Telemetry to watch for AFib 4) aggressive vascular risk factor modification (Aspirin, statin, FLP, etc..).Marland KitchenIf event occurred on Aspirin, then would consider change over to Plavix 5) check PSG if any risk factors for sleep apnea  Beryle Beams 02/08/2011, 12:55 AM

## 2011-02-08 NOTE — ED Provider Notes (Signed)
See prior note   Ward Givens, MD 02/08/11 1135

## 2011-02-08 NOTE — Progress Notes (Addendum)
Patient ID: Timothy Warner, male   DOB: 08/04/1945, 65 y.o.   MRN: [id number]  Subjective: No events overnight. Patient denies chest pain, shortness of breath, abdominal pain.   Objective:  Vital signs in last 24 hours:  Filed Vitals:   02/08/11 0400 02/08/11 0600 02/08/11 0800 02/08/11 1156  BP: 144/95 140/81 139/84 138/86  Pulse: 71 72 67 72  Temp:  97.6 F (36.4 C) 98 F (36.7 C) 98.1 F (36.7 C)  TempSrc:   Oral Oral  Resp: 16 14 16 16   Height:      Weight:      SpO2:  96% 98% 94%    Intake/Output from previous day:   Intake/Output Summary (Last 24 hours) at 02/08/11 1458 Last data filed at 02/08/11 1331  Gross per 24 hour  Intake    150 ml  Output    250 ml  Net   -100 ml    Physical Exam: General: Alert, awake, oriented x3, in no acute distress. HEENT: No bruits, no goiter. Moist mucous membranes, no scleral icterus, no conjunctival pallor. Heart: Regular rate and rhythm, without murmurs, rubs, gallops. Lungs: Clear to auscultation bilaterally. No wheezing, no rhonchi, no rales.  Abdomen: Soft, nontender, nondistended, positive bowel sounds. Extremities: No clubbing cyanosis or edema,  positive pedal pulses. Neuro: Awake Alert oriented x 3. Normal speech and language, no nystagmus. Right lower face weakness. Normal strength, tone, reflexes and coordination. Normal sensation.  Lab Results:  Basic Metabolic Panel:    Component Value Date/Time   NA 135 02/07/2011 1315   K 4.1 02/07/2011 1315   CL 98 02/07/2011 1315   CO2 26 02/07/2011 1315   BUN 11 02/07/2011 1315   CREATININE 0.85 02/07/2011 1315   GLUCOSE 304* 02/07/2011 1315   CALCIUM 9.3 02/07/2011 1315   CBC:    Component Value Date/Time   WBC 8.1 02/07/2011 1315   HGB 12.3* 02/07/2011 1315   HCT 37.2* 02/07/2011 1315   PLT 207 02/07/2011 1315   MCV 82.9 02/07/2011 1315      Lab 02/07/11 1315  WBC 8.1  HGB 12.3*  HCT 37.2*  PLT 207  MCV 82.9  MCH 27.4  MCHC 33.1  RDW 12.7  LYMPHSABS --  MONOABS  --  EOSABS --  BASOSABS --  BANDABS --    Lab 02/07/11 1315  NA 135  K 4.1  CL 98  CO2 26  GLUCOSE 304*  BUN 11  CREATININE 0.85  CALCIUM 9.3  MG --    Studies/Results:  Dg Chest 2 View 02/08/2011   IMPRESSION: Atelectasis or infiltration in the right middle lung.  Segmental elevation of right hemidiaphragm.  Ct Head Wo Contrast 02/07/2011   IMPRESSION:  1.  No acute intracranial abnormality. 2.  Mild cerebral atrophy, chronic small vessel disease, and old bilateral basal ganglia lacunes.    Mr Angiogram Head Wo Contrast 02/08/2011  IMPRESSION: 1.  Negative anterior circulation. 2.  No flow signal in the distal right vertebral artery which could be either occluded (favored) or supplied in a retrograde fashion from the left.  A dominant right AICA is patent.    [name] Wo Contrast 02/07/2011   IMPRESSION:  1.  No acute non hemorrhagic infarct of the left lentiform nucleus with superior extension to the corona radiata. 2.  Multiple other remote lacunar infarcts of the basal ganglia bilaterally. 3.  Age advanced atrophy and extensive white matter disease.  This likely reflects the sequelae of chronic microvascular ischemia.  4.  Abnormal signal in the right vertebral artery, suggesting occlusion.    Medications: Scheduled Meds:   . sodium chloride   Intravenous STAT  . clopidogrel  300 mg Oral NOW  . clopidogrel  75 mg Oral Q breakfast  . enoxaparin  40 mg Subcutaneous QHS  . insulin aspart  0-15 Units Subcutaneous TID WC  . insulin aspart  0-5 Units Subcutaneous QHS  . DISCONTD: aspirin  300 mg Rectal Daily  . DISCONTD: aspirin  325 mg Oral Daily   Continuous Infusions:  PRN Meds:.gadobenate dimeglumine, senna-docusate  Assessment/Plan:  Principal Problem:  *CVA (cerebral infarction) - follow up on Carotid dopplers and 2 D ECHO - emphasize diabetes and blood pressure control - follow up on PT/OT/ST evaluation - neurology following - appreciate input  Active  Problems:  Dysarthria due to cerebrovascular accident - follow upon ST evaluation recommendations   Type 2 diabetes mellitus with hyperglycemia - pt need to be on insulin at this point given A1C of 9.9 - will start lantus and low dose metformin - provide diabetic education   CAD (coronary artery disease) - continue Plavix for now   Right middle lung infiltrate (noted in CXR) - in the setting of subjective fevers at home - will treat with empiric ABX,  Avalox (start today 02/08/2011), day 1/5   Disposition - plan of care and diagnosis, diagnostic studies and test results were discussed with pt and family at bedside - pt and  family verbalized understanding - anticipate d/c in AM    LOS: 1 day   MAGICK-Romonda Parker 02/08/2011, 2:58 PM

## 2011-02-08 NOTE — ED Provider Notes (Signed)
See prior note   Ward Givens, MD 02/08/11 1136

## 2011-02-08 NOTE — Progress Notes (Signed)
Went in to speak with pt about his elevated A1C of 9.9% (02/07/11).  Pt told me he was aware of the number and knew what it meant.  Told me he knows what to do to take care of his sugars, he just "cheats" too much.  Told me he likes candy bars and has a hard time giving them up.  Reminded pt that candy can be worked into a diabetes diet, but that he really needs to restrict his intake of candies and sweets.  Also reminded pt that he has had DM for over 8 years now, and that he may need insulin to better control his CBGs.  As DM progresses over time, many patients do need insulin at some point.  Pt told me he is agreeable to insulin, and is willing to "try to do better".    Discussed the difference between insulin pens and vial/syringe method with pt.  Pt told me he would be willing to use insulin pens if needed for home.  Encouraged pt to begin a CBG diary and to check CBGs at least twice daily and to record.  Encouraged pt to take his CBG readings with him to his next MD appt at the [facility] in [place].  Explained to pt that the MD needs CBG data to make adjustments to his medications.  Pt agreeable.    Will order insulin pen starter kit from pharmacy for pt.  Offered free nutritional classes to pt that are held at [facility].  Pt told me he was not interested in the classes b/c he "knows what to do", he "just needs to do it".  Will follow.

## 2011-02-08 NOTE — Progress Notes (Signed)
Stroke Team Progress Note  SUBJECTIVE  Timothy Warner is a  65 YO WM with h/o DM, HTN, CAD, CM who notes onset of difficulties speaking when arose from bed around 3AM night before last night. Simply went back to bed and when got up around 10:30 AM 02/07/11 noted persistent trouble speaking with "twisted face". Presented to ER where underwent Head CT (WM dz, old bilateral BG lacunes) and Head MRI (acute stroke left lentiform nucleus., along with old bilateral BG lacunes). Since presentation he notes no significant weakness, numbness, diplopia, or prior similar episodes.   His wife is at the bedside. Overall he feels his condition is gradually improving. Face with less droop. [facility] [place].  OBJECTIVE Most recent Vital Signs: Temp: 98 F (36.7 C) (12/10 0800) Temp src: Oral (12/10 0800) BP: 139/84 mmHg (12/10 0800) Pulse Rate: 67  (12/10 0800) Respiratory Rate: 16 O2 Saturdation: 98%  CBG (last 3)   Basename 02/08/11 0734 02/07/11 2112  GLUCAP 221* 331*   Diet:  NPO   Activity:  Up with assistance  DVT Prophylaxis:  Lovenox 40 mg sq daily   Studies: CBC    Component Value Date/Time   WBC 8.1 02/07/2011 1315   RBC 4.49 02/07/2011 1315   HGB 12.3* 02/07/2011 1315   HCT 37.2* 02/07/2011 1315   PLT 207 02/07/2011 1315   MCV 82.9 02/07/2011 1315   MCH 27.4 02/07/2011 1315   MCHC 33.1 02/07/2011 1315   RDW 12.7 02/07/2011 1315   CMP    Component Value Date/Time   NA 135 02/07/2011 1315   K 4.1 02/07/2011 1315   CL 98 02/07/2011 1315   CO2 26 02/07/2011 1315   GLUCOSE 304* 02/07/2011 1315   BUN 11 02/07/2011 1315   CREATININE 0.85 02/07/2011 1315   CALCIUM 9.3 02/07/2011 1315   GFRNONAA >90 02/07/2011 1315   GFRAA >90 02/07/2011 1315   COAGS No results found for this basename: INR,  PROTIME   Lipid Panel    Component Value Date/Time   CHOL 149 02/08/2011 0828       LDL 73, TRI 231, HDL 30 HgbA1C  Lab Results  Component Value Date   HGBA1C 9.9*  02/07/2011   Urine Drug Screen  No results found for this basename: labopia,  cocainscrnur,  labbenz,  amphetmu,  thcu,  labbarb    Alcohol Level No results found for this basename: eth   CT of the brain   1.  No acute intracranial abnormality. 2.  Mild cerebral atrophy, chronic small vessel disease, and old bilateral basal ganglia lacunes.   MRI of the brain   1.  No acute non hemorrhagic infarct of the left lentiform nucleus with superior extension to the corona radiata. 2.  Multiple other remote lacunar infarcts of the basal ganglia bilaterally. 3.  Age advanced atrophy and extensive white matter disease.  This likely reflects the sequelae of chronic microvascular ischemia. 4.  Abnormal signal in the right vertebral artery, suggesting occlusion.  MRA of the brain  ordered   2D Echocardiogram  ordered  Carotid Doppler  ordered  CXR   Atelectasis or infiltration in the right middle lung.  Segmental elevation of right hemidiaphragm.  EKG  normal sinus rhythm.   Physical Exam   Afebrile. Decrease hearing bilateral. Neck is supple. Cardiac exam normal heart sounds. Lungs clear to auscultation. Neurological exam ; Awake  Alert oriented x 3. Normal speech and language.eye movements full without nystagmus.  Face asymmetric with rt lower face weakness.. Tongue midline. Normal strength, tone, reflexes and coordination.Diminished fine finger movements on right and orbits left over right upper extremity. Normal sensation. Gait deferred.  ASSESSMENT Timothy Warner is a 65 y.o. male with a left lentiform nucleus, secondary to small vessel disease. On clopidogrel 75 mg orally every day for secondary stroke prevention.  Aphasia and dysarthria.  Stroke risk factors:  diabetes mellitus, family history, hyperlipidemia, hypertension and CAD with stent 2003  Hospital day # 1  TREATMENT/PLAN Continue clopidogrel 75 mg orally every day for secondary stroke prevention. Follow up MRA and carotid  Doppler. Therapy evaluations. Anticipate stable for discharge in a.m.D/W patient and wife.  Joaquin Music, ANP-BC, GNP-BC [facility] Pager: [phone] 02/08/2011 10:56 AM  Dr. Delia Heady, Stroke Center Medical Director, has personally reviewed chart, pertinent data, examined the patient and developed the plan of care.

## 2011-02-08 NOTE — Progress Notes (Signed)
Speech Language Pathology  Pt unavailable for evaluation at this time due to leaving unit for testing.  Will continue efforts.  Maggy Wyble B. Azaylah Stailey, MSP, CCC-SLP [phone]

## 2011-02-09 ENCOUNTER — Encounter: Payer: Self-pay | Admitting: Internal Medicine

## 2011-02-09 DIAGNOSIS — I359 Nonrheumatic aortic valve disorder, unspecified: Secondary | ICD-10-CM

## 2011-02-09 LAB — GLUCOSE, CAPILLARY: Glucose-Capillary: 270 mg/dL — ABNORMAL HIGH (ref 70–99)

## 2011-02-09 LAB — BASIC METABOLIC PANEL
CO2: 26 mEq/L (ref 19–32)
Calcium: 9.4 mg/dL (ref 8.4–10.5)
Creatinine, Ser: 0.93 mg/dL (ref 0.50–1.35)
GFR calc non Af Amer: 87 mL/min — ABNORMAL LOW (ref >90)

## 2011-02-09 LAB — CBC
MCH: 28.7 pg (ref 26.0–34.0)
MCHC: 34.4 g/dL (ref 30.0–36.0)
MCV: 83.2 fL (ref 78.0–100.0)
Platelets: 226 10*3/uL (ref 150–400)
RBC: 4.71 MIL/uL (ref 4.22–5.81)

## 2011-02-09 MED ORDER — CYANOCOBALAMIN 1000 MCG/ML IJ SOLN: 1000.0000 ug | INTRAMUSCULAR | Status: DC

## 2011-02-09 MED ORDER — INSULIN GLARGINE 100 UNIT/ML ~~LOC~~ SOLN: 5.0000 [IU] | Freq: Every day | SUBCUTANEOUS | Status: DC

## 2011-02-09 MED ORDER — LISINOPRIL 20 MG PO TABS: 20.0000 mg | ORAL_TABLET | Freq: Every day | ORAL | Status: DC

## 2011-02-09 MED ORDER — CLOPIDOGREL BISULFATE 75 MG PO TABS: 75.0000 mg | ORAL_TABLET | Freq: Every day | ORAL | Status: DC

## 2011-02-09 MED ORDER — MOXIFLOXACIN HCL 400 MG PO TABS: 400.0000 mg | ORAL_TABLET | Freq: Every day | ORAL | Status: AC

## 2011-02-09 MED ORDER — ATORVASTATIN CALCIUM 80 MG PO TABS: 40.0000 mg | ORAL_TABLET | Freq: Every day | ORAL | Status: DC

## 2011-02-09 MED ORDER — STROKE: EARLY STAGES OF RECOVERY BOOK
Freq: Once | Status: AC
  Administered 2011-02-09: 13:00:00
  Filled 2011-02-09: qty 1

## 2011-02-09 MED ORDER — STROKE: EARLY STAGES OF RECOVERY BOOK: 1.0000 | Freq: Once | Status: DC

## 2011-02-09 MED ORDER — FENOFIBRATE 145 MG PO TABS: 145.0000 mg | ORAL_TABLET | Freq: Every day | ORAL | Status: DC

## 2011-02-09 MED ORDER — METFORMIN HCL 1000 MG PO TABS: 1000.0000 mg | ORAL_TABLET | Freq: Two times a day (BID) | ORAL | Status: DC

## 2011-02-09 NOTE — Discharge Summary (Signed)
Patient ID: Timothy Warner MRN: [id number] DOB/AGE: Dec 09, 1945 65 y.o.  Admit date: 02/07/2011 Discharge date: 02/09/2011  Primary Care Physician:  No primary provider on file.  Discharge Diagnoses:    Present on Admission:  .CVA (cerebral infarction) .Type 2 diabetes mellitus with hyperglycemia  Principal Problem:  *CVA (cerebral infarction) Active Problems:  Dysarthria due to cerebrovascular accident  Type 2 diabetes mellitus with hyperglycemia  CAD (coronary artery disease)   Current Discharge Medication List    START taking these medications   Details  clopidogrel (PLAVIX) 75 MG tablet Take 1 tablet (75 mg total) by mouth daily with breakfast. Qty: 31 tablet, Refills: 3    insulin glargine (LANTUS) 100 UNIT/ML injection Inject 5 Units into the skin at bedtime. Qty: 10 mL, Refills: 3    moxifloxacin (AVELOX) 400 MG tablet Take 1 tablet (400 mg total) by mouth daily at 6 PM. Qty: 7 tablet, Refills: 0      CONTINUE these medications which have CHANGED   Details  atorvastatin (LIPITOR) 80 MG tablet Take 0.5 tablets (40 mg total) by mouth daily. Qty: 31 tablet, Refills: 3    cyanocobalamin (,VITAMIN B-12,) 1000 MCG/ML injection Inject 1 mL (1,000 mcg total) into the muscle every 30 (thirty) days. Qty: 1 mL, Refills: 1    fenofibrate (TRICOR) 145 MG tablet Take 1 tablet (145 mg total) by mouth daily. Qty: 31 tablet, Refills: 3    metFORMIN (GLUCOPHAGE) 1000 MG tablet Take 1 tablet (1,000 mg total) by mouth 2 (two) times daily with a meal. Qty: 60 tablet, Refills: 3      CONTINUE these medications which have NOT CHANGED   Details  ergocalciferol (VITAMIN D2) 50000 UNITS capsule Take 50,000 Units by mouth once a week.      omeprazole (PRILOSEC) 20 MG capsule Take 20 mg by mouth daily.      PARoxetine (PAXIL) 40 MG tablet Take 40 mg by mouth every morning.      thiamine (VITAMIN B-1) 100 MG tablet Take 100 mg by mouth daily.      indomethacin (INDOCIN SR) 75 MG  CR capsule Take 75 mg by mouth as needed.        STOP taking these medications     aspirin 325 MG tablet      glipiZIDE (GLUCOTROL) 10 MG tablet         Disposition and Follow-up: Pt will need to follow up with PCP in 2-4 weeks to follow up diabetes control. I have discussed upon discharge with pt's wife to monitor CBG daily 2-3 times and to increase the Lantus dose by maximum of 5 units every several days if CBG are uncontrolled. This also needs to be communicated to PCP. Pt will also need to follow up with Neurologist Dr. Pearlean Brownie in 1-2 months and the phone number was provided by Dr. Pearlean Brownie.  Consults: neurology  Significant Diagnostic Studies:   Dg Chest 2 View 02/08/2011   IMPRESSION: Atelectasis or infiltration in the right middle lung.  Segmental elevation of right hemidiaphragm.   Ct Head Wo Contrast 02/07/2011   IMPRESSION:  1.  No acute intracranial abnormality. 2.  Mild cerebral atrophy, chronic small vessel disease, and old bilateral basal ganglia lacunes.    Mr Timothy Warner Wo Contrast 02/07/2011   IMPRESSION:  1.  No acute non hemorrhagic infarct of the left lentiform nucleus with superior extension to the corona radiata. 2.  Multiple other remote lacunar infarcts of the basal ganglia bilaterally. 3.  Age advanced  atrophy and extensive white matter disease.  This likely reflects the sequelae of chronic microvascular ischemia. 4.  Abnormal signal in the right vertebral artery, suggesting occlusion.    Brief H and P: Timothy Warner is an 65 y.o. male with complaints of slurred speech upon awakening at 3:30 AM. He denies having any headache, visual changes or weakness, nor has he had chest pain, or SOB. He denies any ataxia or syncope. His wife reports that he has had some confusion this week. He presented to the ED at 12:40 PM beyond the window for TPA intervention. His symptoms persist at the time of admission. His CT scan were negative for acute findings, and an MRI was performed  revealing an acute non-hemorrhagic infarct of the left lentiform nucleus extending superiorly into the right corona radiata.   Physical Exam on Discharge:  Filed Vitals:   02/08/11 1558 02/08/11 2100 02/09/11 0100 02/09/11 0500  BP: 151/89 132/84 124/81 157/90  Pulse: 79  81 71  Temp: 98.4 F (36.9 C) 97.9 F (36.6 C) 98 F (36.7 C) 97.9 F (36.6 C)  TempSrc:  Oral Oral   Resp: 17 20 20 20   Height:      Weight:      SpO2: 93% 97% 96% 94%     Intake/Output Summary (Last 24 hours) at 02/09/11 1140 Last data filed at 02/09/11 0900  Gross per 24 hour  Intake    750 ml  Output      0 ml  Net    750 ml    General: Alert, awake, oriented x3, in no acute distress. HEENT: No bruits, no goiter. Heart: Regular rate and rhythm, without murmurs, rubs, gallops. Lungs: Clear to auscultation bilaterally. Abdomen: Soft, nontender, nondistended, positive bowel sounds. Extremities: No clubbing cyanosis or edema with positive pedal pulses. Neuro: Grossly intact, nonfocal. S;urred speech still noted.  CBC:    Component Value Date/Time   WBC 9.8 02/09/2011 0530   HGB 13.5 02/09/2011 0530   HCT 39.2 02/09/2011 0530   PLT 226 02/09/2011 0530   MCV 83.2 02/09/2011 0530    Basic Metabolic Panel:    Component Value Date/Time   NA 140 02/09/2011 0530   K 3.7 02/09/2011 0530   CL 99 02/09/2011 0530   CO2 26 02/09/2011 0530   BUN 12 02/09/2011 0530   CREATININE 0.93 02/09/2011 0530   GLUCOSE 169* 02/09/2011 0530   CALCIUM 9.4 02/09/2011 0530    Hospital Course:   Principal Problem:  *CVA (cerebral infarction)  - pt clinically improving, will need outpatient ST - emphasized diabetes and blood pressure control  - neurology will follow up with patient in an outpatient setting in 1-2 months  Active Problems:  Dysarthria due to cerebrovascular accident  - pt will need ST in an outpatient setting  Type 2 diabetes mellitus with hyperglycemia  - pt need to be on insulin at this  point given A1C of 9.9  - pt will continue to take Lantus upon discharge and the treatment plan was discussed with the patient - pt will continue to take Metformin as per his home medication regimen - provided diabetic education   CAD (coronary artery disease)  - continue Plavix for now  - pt was advised to stop taking Aspirin  Right middle lung infiltrate (noted in CXR)  - in the setting of subjective fevers at home  - will treat with empiric ABX, Avalox (start today 02/08/2011), day 2/7  Disposition  - plan of care and  diagnosis, diagnostic studies and test results were discussed with pt and family at bedside  - pt and family verbalized understanding  - pt is stable for d/c  Time spent on Discharge: Over 30 minutes  Signed: MAGICK-MYERS, ISKRA 02/09/2011, 11:40 AM

## 2011-02-09 NOTE — Progress Notes (Signed)
Inpatient Diabetes Program Recommendations  AACE/ADA: New Consensus Statement on Inpatient Glycemic Control (2009)  Target Ranges:  Prepandial:   less than 140 mg/dL      Peak postprandial:   less than 180 mg/dL (1-2 hours)      Critically ill patients:  140 - 180 mg/dL   CBGs 16/10: 960/ 454/ 244/ 245 mg/dl CBGs today: 098/ 119 mg/dl Noted Lantus 5 units QHS started last pm along with Metformin.  Inpatient Diabetes Program Recommendations Insulin - Basal: Please increase Lantus to 8 units QHS. Insulin - Meal Coverage: Please add Novolog 4 units tid with meals while pt in hospital.  Spoke with pt at length yesterday about the importance of good CBG control.  RN to provide pt with written educational materials.  Ordered insulin pen starter kit yesterday and RN to instruct pt on insulin pen administration.    MD- If you plan to d/c pt home on Lantus pen, please make sure to write separate prescription for insulin pen needles for pt.

## 2011-02-09 NOTE — Progress Notes (Signed)
  Speech Language Pathology Evaluation Patient Details Name: ROCCO KERKHOFF MRN: [id number] DOB: 1945-11-04 Today's Date: 02/09/2011  Problem List:  Patient Active Problem List  Diagnoses  . DM  . HYPERLIPIDEMIA  . HYPERTENSION  . CARDIOMYOPATHY  . ERECTILE DYSFUNCTION, ORGANIC  . CVA (cerebral infarction)  . Dysarthria due to cerebrovascular accident  . Type 2 diabetes mellitus with hyperglycemia  . CAD (coronary artery disease)    Past Medical History:  Past Medical History  Diagnosis Date  . DM (diabetes mellitus)     x8 years  . HTN (hypertension)   . HLD (hyperlipidemia)   . CAD (coronary artery disease)     cypher stenting to the circ and occluded RCA 2003.   . Cardiomyopathy     EF 45%   Past Surgical History:  Past Surgical History  Procedure Date  . Coronary angioplasty with stent placement      Clinical Impression: Pts presents with a moderate dysarthria of speech and mild neurogenic dysfluency s/p left CVA in lentiform nucleus with extension into corona radiata.  Language intact.  Pt would benefit from OPSLP to address speech intelligibility.   SLP Recommendation/Assessment: All further Speech Language Pathology  needs can be addressed in the next venue of care  Follow up Recommendations: Outpatient SLP  Yomaris Palecek L. Samson Frederic, [place] CCC/SLP Pager [phone] Blenda Mounts Laurice 02/09/2011, 10:03 AM

## 2011-02-09 NOTE — Progress Notes (Signed)
Occupational Therapy Evaluation Patient Details Name: Timothy Warner MRN: [id number] DOB: January 06, 1946 Today's Date: 02/09/2011  Problem List:  Patient Active Problem List  Diagnoses  . DM  . HYPERLIPIDEMIA  . HYPERTENSION  . CARDIOMYOPATHY  . ERECTILE DYSFUNCTION, ORGANIC  . CVA (cerebral infarction)  . Dysarthria due to cerebrovascular accident  . Type 2 diabetes mellitus with hyperglycemia  . CAD (coronary artery disease)    Past Medical History:  Past Medical History  Diagnosis Date  . DM (diabetes mellitus)     x8 years  . HTN (hypertension)   . HLD (hyperlipidemia)   . CAD (coronary artery disease)     cypher stenting to the circ and occluded RCA 2003.   . Cardiomyopathy     EF 45%   Past Surgical History:  Past Surgical History  Procedure Date  . Coronary angioplasty with stent placement     OT Assessment/Plan/Recommendation OT Assessment Clinical Impression Statement: This 65 y.o. male presents to OT s/p CVA.  Pt. with dysarthria, but no other deficits noted.  Pt. appears to be at baseline level of functioning for ADLs.  No OT needs identified, will sign off. OT Recommendation/Assessment: Patient does not need any further OT services OT Goals    OT Evaluation Precautions/Restrictions  Precautions Precautions:  (none) Prior Functioning Home Living Lives With: Spouse Bathroom Shower/Tub: Tub/shower unit;Curtain Bathroom Toilet: Standard Prior Function Level of Independence: Independent with basic ADLs;Independent with homemaking with ambulation;Independent with gait Able to Take Stairs?: Yes Driving: Yes Vocation: Unemployed (recently laid off) ADL ADL Eating/Feeding: Simulated;Independent Where Assessed - Eating/Feeding: Chair Grooming: Simulated;Wash/dry hands;Wash/dry face;Teeth care;Brushing hair;Shaving;Independent (Pt. reports he performed this am independently) Where Assessed - Grooming: Standing at sink Upper Body Bathing:  Simulated;Independent Where Assessed - Upper Body Bathing: Sitting, chair;Standing at sink Lower Body Bathing: Simulated;Independent Where Assessed - Lower Body Bathing: Standing at sink;Sit to stand from chair Upper Body Dressing: Simulated;Independent Where Assessed - Upper Body Dressing: Sitting, chair Lower Body Dressing: Simulated;Independent Where Assessed - Lower Body Dressing: Sit to stand from chair Toilet Transfer: Simulated;Independent Toilet Transfer Method: Proofreader: Regular height toilet Toileting - Clothing Manipulation: Simulated;Independent Where Assessed - Toileting Clothing Manipulation: Sit on 3-in-1 or toilet Toileting - Hygiene: Simulated;Independent Where Assessed - Toileting Hygiene: Sit on 3-in-1 or toilet Tub/Shower Transfer: Simulated;Independent (stepping over ~14" high object to simulate tub) Tub/Shower Transfer Method: Ambulating ADL Comments: Pt. reports he has been performing ADLs in the room.  Pt is independent Vision/Perception  Vision - History Baseline Vision: Bifocals Patient Visual Report: No change from baseline Vision - Assessment Eye Alignment: Within Functional Limits Vision Assessment: Vision tested Ocular Range of Motion: Within Functional Limits Alignment/Gaze Preference: Within Defined Limits Tracking/Visual Pursuits: Able to track stimulus in all quads without difficulty Saccades: Within functional limits (static and moving saccades) Additional Comments: Pt. able to read newspaper print independently with glasses and no difficulty Perception Perception: Within Functional Limits Praxis Praxis: Intact Cognition Cognition Arousal/Alertness: Awake/alert Overall Cognitive Status: Appears within functional limits for tasks assessed Orientation Level: Oriented X4 Sensation/Coordination Sensation Light Touch: Appears Intact (Pt denies sensory changes) Coordination Gross Motor Movements are Fluid and  Coordinated: Yes Fine Motor Movements are Fluid and Coordinated: Yes Extremity Assessment RUE Assessment RUE Assessment: Within Functional Limits LUE Assessment LUE Assessment: Within Functional Limits Mobility  Bed Mobility Bed Mobility: No Transfers Transfers: Yes Sit to Stand: 7: Independent Stand to Sit: 7: Independent Exercises   End of Session OT - End of Session Activity  Tolerance: Patient tolerated treatment well Patient left: in chair General Behavior During Session: [facility] for tasks performed Cognition: [facility] for tasks performed   Shaurya Rawdon M 02/09/2011, 10:33 AM

## 2011-02-09 NOTE — Progress Notes (Signed)
Stroke Team Progress Note  SUBJECTIVE  Timothy Warner is a 65 y.o. male whose stroke symptoms are gradually improving. Family members at the bedside.  OBJECTIVE Most recent Vital Signs: Temp: 97.9 F (36.6 C) (12/11 0500) Temp src: Oral (12/11 0100) BP: 157/90 mmHg (12/11 0500) Pulse Rate: 71  (12/11 0500) Respiratory Rate: 20 O2 Saturdation: 94%  CBG (last 3)   Basename 02/09/11 0753 02/08/11 2046 02/08/11 1624  GLUCAP 197* 245* 244*   Intake/Output from previous day: 12/10 0701 - 12/11 0700 In: 390 [P.O.:390] Out: 250 [Urine:250]  IV Fluid Intake:    Diet:  Carb Control thin liquids  Activity:  Up with assistance  DVT Prophylaxis:  Lovenox 40 mg sq daily   Studies: CBC    Component Value Date/Time   WBC 9.8 02/09/2011 0530   RBC 4.71 02/09/2011 0530   HGB 13.5 02/09/2011 0530   HCT 39.2 02/09/2011 0530   PLT 226 02/09/2011 0530   MCV 83.2 02/09/2011 0530   MCH 28.7 02/09/2011 0530   MCHC 34.4 02/09/2011 0530   RDW 12.9 02/09/2011 0530   CMP    Component Value Date/Time   NA 140 02/09/2011 0530   K 3.7 02/09/2011 0530   CL 99 02/09/2011 0530   CO2 26 02/09/2011 0530   GLUCOSE 169* 02/09/2011 0530   BUN 12 02/09/2011 0530   CREATININE 0.93 02/09/2011 0530   CALCIUM 9.4 02/09/2011 0530   GFRNONAA 87* 02/09/2011 0530   GFRAA >90 02/09/2011 0530   COAGS No results found for this basename: INR, PROTIME   Lipid Panel    Component Value Date/Time   CHOL 149 02/08/2011 0828   TRIG 231* 02/08/2011 0828   HDL 30* 02/08/2011 0828   CHOLHDL 5.0 02/08/2011 0828   VLDL 46* 02/08/2011 0828   LDLCALC 73 02/08/2011 0828   HgbA1C  Lab Results  Component Value Date   HGBA1C 9.9* 02/08/2011   Urine Drug Screen  No results found for this basename: labopia, cocainscrnur, labbenz, amphetmu, thcu, labbarb    Alcohol Level No results found for this basename: eth    CT of the brain 1. No acute intracranial abnormality. 2. Mild cerebral atrophy, chronic small  vessel disease, and old bilateral basal ganglia lacunes.  MRI of the brain 1. No acute non hemorrhagic infarct of the left lentiform nucleus with superior extension to the corona radiata. 2. Multiple other remote lacunar infarcts of the basal ganglia bilaterally. 3. Age advanced atrophy and extensive white matter disease. This likely reflects the sequelae of chronic microvascular ischemia. 4. Abnormal signal in the right vertebral artery, suggesting occlusion. MRA of the brain  1.  Negative anterior circulation. 2.  No flow signal in the distal right vertebral artery which could be either occluded (favored) or supplied in a retrograde fashion from the left.  A dominant right AICA is patent.    2D Echocardiogram  ordered   Carotid Doppler   Bilateral: No evidence of hemodynamically significant internal carotid artery stenosis.  Right: Vertebral artery flow is antegrade.  Left: Vertebral artery flow is antegrade. Waveform is spiked with loss of diastolic component suggesting distal occlusion.  Physical Exam  Afebrile. Decrease hearing bilateral. Neck is supple. Cardiac exam normal heart sounds. Lungs clear to auscultation.  Neurological exam ; Awake Alert oriented x 3. Normal speech and language.eye movements full without nystagmus. Face asymmetric with rt lower face weakness.. Tongue midline.  Normal strength, tone, reflexes and coordination.Diminished fine finger movements on right and orbits left  over right upper extremity. Normal sensation. Gait deferred.   ASSESSMENT Timothy Warner is a 65 y.o. male with a left lentiform nucleus, secondary to small vessel disease. On clopidogrel 75 mg orally every day for secondary stroke prevention.  Aphasia and dysarthria. New therapy needs  Stroke risk factors: diabetes mellitus, family history, hyperlipidemia, hypertension and CAD with stent 2003  Hospital day # 2  TREATMENT/PLAN Continue clopidogrel 75 mg orally every day for secondary stroke  prevention. 2-D echo pending. If no cause of stroke, okay for discharge from neurology standpoint. Follow up with Dr. Pearlean Brownie in 2 months.  Joaquin Music, ANP-BC, GNP-BC [facility] Pager: [phone] 02/09/2011 10:56 AM  Dr. Delia Heady, Stroke Center Medical Director, has personally reviewed chart, pertinent data, examined the patient and developed the plan of care.

## 2011-02-09 NOTE — Progress Notes (Signed)
Physical Therapy Patient Details Name: Timothy Warner MRN: [id number] DOB: 1945/12/29 Today's Date: 02/09/2011  Spoke with OT who notes pt is Independent with all mobility and not further needs for PT or OT.  Will hold PT eval and sign off.  If pt begins to have mobility difficulties please re-order.    Sunny Schlein, [place] [phone] 02/09/2011, 10:41 AM

## 2011-02-09 NOTE — Progress Notes (Signed)
  Echocardiogram 2D Echocardiogram has been performed.  Caedin Mogan Nira Retort 02/09/2011, 11:32 AM

## 2011-02-09 NOTE — Progress Notes (Signed)
Discharge instructions reviewed with patient and his wife, they stated their understanding.  Stroke booklet given and reviewed with patient and wife.  Also reviewed signs and symptoms of cva/tia and calling 911 as soon as possible.  Patient new insulin diabetic.  Reviewed Insulin Pen with patient and wife and patient gave self afternoon insulin injection.  IV discontinued.  Patient discharged home with wife via wheelchair and volunteer.  Timothy Warner

## 2011-02-10 NOTE — Progress Notes (Signed)
   CARE MANAGEMENT NOTE 02/10/2011  Patient:  Timothy Warner, Timothy Warner   Account Number:  [id number]  Date Initiated:  02/10/2011  Documentation initiated by:  Onnie Boer  Subjective/Objective Assessment:   PT WAS ADMITTED WITH SLURRED SPEECH     Action/Plan:   PROGRESSION OF CARE AND DISCHARGE PLANNING   Anticipated DC Date:  02/09/2011   Anticipated DC Plan:  HOME/SELF CARE      DC Planning Services  CM consult      Choice offered to / List presented to:             Status of service:  Completed, signed off Medicare Important Message given?   (If response is "NO", the following Medicare IM given date fields will be blank) Date Medicare IM given:   Date Additional Medicare IM given:    Discharge Disposition:    Per UR Regulation:  Reviewed for med. necessity/level of care/duration of stay  Comments:  02/10/11 Onnie Boer, RN, BSN 1442 UR COMPLETED PT WAS DC'D TO HOME WITH SELF CARE AFTER HAVING A CVA, PT HAD NO NEEDS AT DC

## 2011-05-01 ENCOUNTER — Inpatient Hospital Stay (HOSPITAL_COMMUNITY)
Admission: EM | Admit: 2011-05-01 | Discharge: 2011-05-02 | DRG: 065 | Disposition: A | Discharge status: Home / Self Care | Payer: Non-veteran care | Attending: Family Medicine | Admitting: Family Medicine

## 2011-05-01 ENCOUNTER — Emergency Department (HOSPITAL_COMMUNITY): Payer: Non-veteran care

## 2011-05-01 ENCOUNTER — Encounter (HOSPITAL_COMMUNITY): Payer: Self-pay | Admitting: *Deleted

## 2011-05-01 DIAGNOSIS — E785 Hyperlipidemia, unspecified: Secondary | ICD-10-CM | POA: Diagnosis present

## 2011-05-01 DIAGNOSIS — I6930 Unspecified sequelae of cerebral infarction: Secondary | ICD-10-CM

## 2011-05-01 DIAGNOSIS — I10 Essential (primary) hypertension: Secondary | ICD-10-CM | POA: Diagnosis present

## 2011-05-01 DIAGNOSIS — Z9861 Coronary angioplasty status: Secondary | ICD-10-CM

## 2011-05-01 DIAGNOSIS — H532 Diplopia: Secondary | ICD-10-CM | POA: Diagnosis present

## 2011-05-01 DIAGNOSIS — I6350 Cerebral infarction due to unspecified occlusion or stenosis of unspecified cerebral artery: Principal | ICD-10-CM | POA: Diagnosis present

## 2011-05-01 DIAGNOSIS — E119 Type 2 diabetes mellitus without complications: Secondary | ICD-10-CM | POA: Diagnosis present

## 2011-05-01 DIAGNOSIS — Z8673 Personal history of transient ischemic attack (TIA), and cerebral infarction without residual deficits: Secondary | ICD-10-CM

## 2011-05-01 DIAGNOSIS — I2510 Atherosclerotic heart disease of native coronary artery without angina pectoris: Secondary | ICD-10-CM | POA: Diagnosis not present

## 2011-05-01 DIAGNOSIS — N529 Male erectile dysfunction, unspecified: Secondary | ICD-10-CM

## 2011-05-01 DIAGNOSIS — Z794 Long term (current) use of insulin: Secondary | ICD-10-CM

## 2011-05-01 DIAGNOSIS — H4902 Third [oculomotor] nerve palsy, left eye: Secondary | ICD-10-CM

## 2011-05-01 DIAGNOSIS — Z7902 Long term (current) use of antithrombotics/antiplatelets: Secondary | ICD-10-CM

## 2011-05-01 DIAGNOSIS — I428 Other cardiomyopathies: Secondary | ICD-10-CM | POA: Diagnosis present

## 2011-05-01 DIAGNOSIS — R471 Dysarthria and anarthria: Secondary | ICD-10-CM | POA: Diagnosis present

## 2011-05-01 DIAGNOSIS — I429 Cardiomyopathy, unspecified: Secondary | ICD-10-CM | POA: Insufficient documentation

## 2011-05-01 DIAGNOSIS — IMO0002 Reserved for concepts with insufficient information to code with codable children: Secondary | ICD-10-CM

## 2011-05-01 DIAGNOSIS — I639 Cerebral infarction, unspecified: Secondary | ICD-10-CM | POA: Insufficient documentation

## 2011-05-01 DIAGNOSIS — E1165 Type 2 diabetes mellitus with hyperglycemia: Secondary | ICD-10-CM | POA: Diagnosis present

## 2011-05-01 HISTORY — DX: Cerebral infarction, unspecified: I63.9

## 2011-05-01 LAB — CBC
HCT: 36.8 % — ABNORMAL LOW (ref 39.0–52.0)
Hemoglobin: 12.3 g/dL — ABNORMAL LOW (ref 13.0–17.0)
MCH: 28.2 pg (ref 26.0–34.0)
MCHC: 33.4 g/dL (ref 30.0–36.0)
MCV: 84.4 fL (ref 78.0–100.0)
Platelets: 158 10*3/uL (ref 150–400)
RBC: 4.36 MIL/uL (ref 4.22–5.81)
RDW: 13.9 % (ref 11.5–15.5)
WBC: 6.8 10*3/uL (ref 4.0–10.5)

## 2011-05-01 LAB — BASIC METABOLIC PANEL
BUN: 11 mg/dL (ref 6–23)
CO2: 29 mEq/L (ref 19–32)
Calcium: 10 mg/dL (ref 8.4–10.5)
Chloride: 103 mEq/L (ref 96–112)
Creatinine, Ser: 0.99 mg/dL (ref 0.50–1.35)
GFR calc Af Amer: >90 mL/min (ref >90)
GFR calc non Af Amer: 85 mL/min — ABNORMAL LOW (ref >90)
Glucose, Bld: 211 mg/dL — ABNORMAL HIGH (ref 70–99)
Potassium: 4.3 mEq/L (ref 3.5–5.1)
Sodium: 140 mEq/L (ref 135–145)

## 2011-05-01 MED ORDER — CLOPIDOGREL BISULFATE 75 MG PO TABS
75.0000 mg | ORAL_TABLET | Freq: Every day | ORAL | Status: DC
  Administered 2011-05-01 – 2011-05-02 (×2): 75 mg via ORAL
  Filled 2011-05-01 (×2): qty 1

## 2011-05-01 MED ORDER — SENNOSIDES-DOCUSATE SODIUM 8.6-50 MG PO TABS: 1.0000 | ORAL_TABLET | Freq: Every evening | ORAL | Status: DC | PRN

## 2011-05-01 MED ORDER — ATORVASTATIN CALCIUM 40 MG PO TABS
40.0000 mg | ORAL_TABLET | Freq: Every day | ORAL | Status: DC
  Administered 2011-05-01: 40 mg via ORAL
  Filled 2011-05-01 (×2): qty 1

## 2011-05-01 MED ORDER — VITAMIN D (ERGOCALCIFEROL) 1.25 MG (50000 UNIT) PO CAPS: 50000.0000 [IU] | ORAL_CAPSULE | ORAL | Status: DC

## 2011-05-01 MED ORDER — VITAMIN B-1 100 MG PO TABS
100.0000 mg | ORAL_TABLET | Freq: Every day | ORAL | Status: DC
  Administered 2011-05-01 – 2011-05-02 (×2): 100 mg via ORAL
  Filled 2011-05-01 (×2): qty 1

## 2011-05-01 MED ORDER — INSULIN ASPART 100 UNIT/ML ~~LOC~~ SOLN: 0.0000 [IU] | Freq: Three times a day (TID) | SUBCUTANEOUS | Status: DC

## 2011-05-01 MED ORDER — LISINOPRIL 20 MG PO TABS
20.0000 mg | ORAL_TABLET | Freq: Every day | ORAL | Status: DC
  Administered 2011-05-01 – 2011-05-02 (×2): 20 mg via ORAL
  Filled 2011-05-01 (×2): qty 1

## 2011-05-01 MED ORDER — PANTOPRAZOLE SODIUM 40 MG PO TBEC
40.0000 mg | DELAYED_RELEASE_TABLET | Freq: Every day | ORAL | Status: DC
  Administered 2011-05-01: 40 mg via ORAL
  Filled 2011-05-01: qty 1

## 2011-05-01 MED ORDER — ONDANSETRON HCL 4 MG/2ML IJ SOLN: 4.0000 mg | Freq: Four times a day (QID) | INTRAMUSCULAR | Status: DC | PRN

## 2011-05-01 MED ORDER — ONDANSETRON HCL 4 MG/2ML IJ SOLN: 4.0000 mg | Freq: Three times a day (TID) | INTRAMUSCULAR | Status: AC | PRN

## 2011-05-01 MED ORDER — INSULIN ASPART 100 UNIT/ML ~~LOC~~ SOLN: 0.0000 [IU] | Freq: Every day | SUBCUTANEOUS | Status: DC

## 2011-05-01 MED ORDER — STROKE: EARLY STAGES OF RECOVERY BOOK
1.0000 | Freq: Once | Status: DC
  Filled 2011-05-01: qty 1

## 2011-05-01 MED ORDER — PAROXETINE HCL 20 MG PO TABS
40.0000 mg | ORAL_TABLET | Freq: Every morning | ORAL | Status: DC
  Administered 2011-05-02: 40 mg via ORAL
  Filled 2011-05-01: qty 2

## 2011-05-01 MED ORDER — SODIUM CHLORIDE 0.9 % IV SOLN
INTRAVENOUS | Status: AC
  Administered 2011-05-01: 23:00:00 via INTRAVENOUS

## 2011-05-01 MED ORDER — OXYCODONE HCL 5 MG PO TABS: 5.0000 mg | ORAL_TABLET | Freq: Four times a day (QID) | ORAL | Status: DC | PRN

## 2011-05-01 MED ORDER — HYDROMORPHONE HCL PF 1 MG/ML IJ SOLN: 1.0000 mg | INTRAMUSCULAR | Status: DC | PRN

## 2011-05-01 MED ORDER — ACETAMINOPHEN 325 MG PO TABS: 650.0000 mg | ORAL_TABLET | ORAL | Status: DC | PRN

## 2011-05-01 MED ORDER — OMEGA-3-ACID ETHYL ESTERS 1 G PO CAPS
1.0000 g | ORAL_CAPSULE | Freq: Every day | ORAL | Status: DC
  Administered 2011-05-02: 1 g via ORAL
  Filled 2011-05-01 (×2): qty 1

## 2011-05-01 MED ORDER — INSULIN GLARGINE 100 UNIT/ML ~~LOC~~ SOLN: 22.0000 [IU] | Freq: Every day | SUBCUTANEOUS | Status: DC

## 2011-05-01 MED ORDER — ENOXAPARIN SODIUM 40 MG/0.4ML ~~LOC~~ SOLN
40.0000 mg | SUBCUTANEOUS | Status: DC
  Administered 2011-05-01: 40 mg via SUBCUTANEOUS
  Filled 2011-05-01 (×2): qty 0.4

## 2011-05-01 NOTE — ED Notes (Signed)
Dr. Juleen China at the bedside. Pt remains on cardiac monitor. Vital signs stable. He remains alert and oriented x4. No signs of distress noted at the time. Wife at bedside.

## 2011-05-01 NOTE — Consult Note (Addendum)
Reason for Consult: "double vision"  HPI: Timothy Warner is an 66 y.o. Male with double vision since this morning. He woke up with symptoms. He denies having double vision when he closes one of his eyes. He recently had strokes with residual swallowing dysfunction.   Past Medical History  Diagnosis Date  . DM (diabetes mellitus)     x8 years  . HTN (hypertension)   . HLD (hyperlipidemia)   . CAD (coronary artery disease)     cypher stenting to the circ and occluded RCA 2003.   . Cardiomyopathy     EF 45%  . Stroke    Medications: I have reviewed the patient's current medications.  Past Surgical History  Procedure Date  . Coronary angioplasty with stent placement    Family History  Problem Relation Age of Onset  . Stroke      family hx  . Diabetes      family hx  . Hypertension      family hx    Social History:  reports that he has quit smoking. He does not have any smokeless tobacco history on file. He reports that he does not drink alcohol or use illicit drugs.  Allergies: No Known Allergies  ROS: as above  Blood pressure 151/78, pulse 75, temperature 98.6 F (37 C), temperature source Oral, resp. rate 18, SpO2 99.00%.  Neurological exam: AAO*3. No aphasia. Drowsy. Followed complex commands. Cranial nerves: dysconjugate gaze with left eye being down and out when patient looks straight ahead, patient has limited downward gaze of the left eye, PERRL. Visual fields were full. Sensation to V1 through V3 areas of the face was intact and symmetric throughout. Left eye ptosis. There was left facial droop. Hearing to finger rub was equal and symmetrical bilaterally. Shoulder shrug was 5/5 and symmetric bilaterally. Head rotation was 5/5 bilaterally. There was mild dysarthria noted. Motor: strength was 5/5 and symmetric throughout. Sensory: was intact throughout to light touch, pinprick. Coordination: finger-to-nose were intact and symmetric bilaterally. Reflexes: were 2+ in upper  extremities and 1+ at the knees and 1+ at the ankles. Plantar response was mute bilaterally. Gait: Romberg test was negative. No ataxia.  Results for orders placed during the hospital encounter of 05/01/11 (from the past 48 hour(s))  CBC     Status: Abnormal   Collection Time   05/01/11  3:32 PM      Component Value Range Comment   WBC 6.8  4.0 - 10.5 (K/uL)    RBC 4.36  4.22 - 5.81 (MIL/uL)    Hemoglobin 12.3 (*) 13.0 - 17.0 (g/dL)    HCT 25.9 (*) 56.3 - 52.0 (%)    MCV 84.4  78.0 - 100.0 (fL)    MCH 28.2  26.0 - 34.0 (pg)    MCHC 33.4  30.0 - 36.0 (g/dL)    RDW 87.5  64.3 - 32.9 (%)    Platelets 158  150 - 400 (K/uL)   BASIC METABOLIC PANEL     Status: Abnormal   Collection Time   05/01/11  3:32 PM      Component Value Range Comment   Sodium 140  135 - 145 (mEq/L)    Potassium 4.3  3.5 - 5.1 (mEq/L)    Chloride 103  96 - 112 (mEq/L)    CO2 29  19 - 32 (mEq/L)    Glucose, Bld 211 (*) 70 - 99 (mg/dL)    BUN 11  6 - 23 (mg/dL)    Creatinine,  Ser 0.99  0.50 - 1.35 (mg/dL)    Calcium 16.1  8.4 - 10.5 (mg/dL)    GFR calc non Af Amer 85 (*) >90 (mL/min)    GFR calc Af Amer >90  >90 (mL/min)    Ct Head Wo Contrast  05/01/2011  *RADIOLOGY REPORT*  Clinical Data: Blurred vision since this morning.  History of stroke.  CT HEAD WITHOUT CONTRAST  Technique:  Contiguous axial images were obtained from the base of the skull through the vertex without contrast.  Comparison: 02/07/2011  Findings: There is mild central cortical atrophy.  Periventricular white matter changes are consistent with small vessel disease. There is no evidence for hemorrhage, mass lesion, or acute infarction.  Scattered small lacunar infarcts in the basal ganglia are chronic.  Bone windows are unremarkable.  IMPRESSION:  1.  Atrophy and small vessel disease. 2. No evidence for acute intracranial abnormality.  Original Report Authenticated By: Patterson Hammersmith, M.D.   Assessment/Plan: 66 years old man with binocular double  vision since am. Patient is a vasculopath with recent CVA. 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 HgbA1c, fasting lipid panel 2. MRI, MRA  of the brain without contrast 3. PT consult, OT consult, Speech consult 4. Echocardiogram 5. Carotid dopplers 6. Prophylactic therapy-Antiplatelet med: Plavix - dose 75 mg PO daily 7. Risk factor modification 8. Neurochecks q4h 9. Vital Signs q4h 10. Cardiac Monitoring 11. Keep MAP b/w 120 to 130 12. DVT prophylaxis  Charliee Krenz 05/01/2011, 5:16 PM

## 2011-05-01 NOTE — H&P (Signed)
DATE OF ADMISSION:  05/01/2011  PCP:  [facility]   Chief Complaint:  Double Vision   HPI: Timothy Warner is an 66 y.o. male  Who presented to the ED with complaints of double vision since awakening in the AM.  He also reported having a slight headache, but denied any weakness or chest pain or palpitations or SOB.  He denied having any nausea or vomiting.  He denied any weakness of one side of his body and denied ataxia or problems.   He did report vertigo and sight problems due to the diplopia.  The symptoms did not get better and several hours later he came to the ED and was outside of the window for treatment with TPA.  A CT scan of the Head was performed with negative results, however, a subsequent MRI Study was performed and revealed a small brainstem infarct.  Neuro was consulted.  Patient was referred for medical admission.  Of note, patient had a recent CVA in 01/2011 with residual deficits of dysarthria which has been improving.    Past Medical History  Diagnosis Date  . DM (diabetes mellitus)     x8 years  . HTN (hypertension)   . HLD (hyperlipidemia)   . CAD (coronary artery disease)     cypher stenting to the circ and occluded RCA 2003.   . Cardiomyopathy     EF 45%  . Stroke     Past Surgical History  Procedure Date  . Coronary angioplasty with stent placement     Medications:  HOME MEDS: Prior to Admission medications   Medication Sig Start Date End Date Taking? Authorizing Provider  atorvastatin (LIPITOR) 80 MG tablet Take 40 mg by mouth at bedtime. For cholesterol   Yes Historical Provider, MD  clopidogrel (PLAVIX) 75 MG tablet Take 75 mg by mouth daily.   Yes Historical Provider, MD  Cyanocobalamin (VITAMIN B-12 IJ) Inject 1,000 mcg/kg/hr as directed every 30 (thirty) days. On 1st day of every month   Yes Historical Provider, MD  ergocalciferol (VITAMIN D2) 50000 UNITS capsule Take 50,000 Units by mouth once a week. On Fridays   Yes Historical Provider, MD  insulin  glargine (LANTUS) 100 UNIT/ML injection Inject 22 Units into the skin at bedtime.   Yes Historical Provider, MD  insulin regular (NOVOLIN R,HUMULIN R) 100 units/mL injection Inject into the skin See admin instructions. Inject subcutaneously based on sliding scale daily - low dose   Yes Historical Provider, MD  lisinopril (PRINIVIL,ZESTRIL) 40 MG tablet Take 20 mg by mouth daily.   Yes Historical Provider, MD  metFORMIN (GLUCOPHAGE) 1000 MG tablet Take 1,000 mg by mouth 2 (two) times daily with a meal.   Yes Historical Provider, MD  Omega-3 Fatty Acids (FISH OIL) 1000 MG CAPS Take 1,000 mg by mouth daily.   Yes Historical Provider, MD  omeprazole (PRILOSEC) 20 MG capsule Take 20 mg by mouth daily.    Yes Historical Provider, MD  PARoxetine (PAXIL) 40 MG tablet Take 40 mg by mouth every morning.    Yes Historical Provider, MD  thiamine (VITAMIN B-1) 100 MG tablet Take 100 mg by mouth daily.    Yes Historical Provider, MD   stroke: mapping our early stages of recovery book MISC 1 each by Does not apply route once. 02/09/11   Iskra Magick-Myers, MD  indomethacin (INDOCIN SR) 75 MG CR capsule Take 75 mg by mouth as needed. For gout flare ups    Historical Provider, MD    Allergies:  Allergies  Allergen Reactions  . Ambien (Zolpidem Tartrate) Other (See Comments)    Hallucinations and Altered Mental Status    Social History:   reports that he has quit smoking. He does not have any smokeless tobacco history on file. He reports that he does not drink alcohol or use illicit drugs.  Family History: Family History  Problem Relation Age of Onset  . Stroke      family hx  . Diabetes      family hx  . Hypertension      family hx     Review of Systems:  The patient denies anorexia, fever, weight loss, decreased hearing, hoarseness, chest pain, syncope, dyspnea on exertion, peripheral edema, balance deficits, hemoptysis, abdominal pain, melena, hematochezia, severe indigestion/heartburn, hematuria,  incontinence, genital sores, muscle weakness, suspicious skin lesions, transient blindness, difficulty walking, depression, unusual weight change, abnormal bleeding, enlarged lymph nodes, angioedema, and breast masses.   Physical Exam:  GEN:  Pleasant 66 year old well nourished and well developed Caucasian male examined  and in no acute distress; cooperative with exam Filed Vitals:   05/01/11 1508 05/01/11 1509 05/01/11 1727 05/01/11 2054  BP:  151/78 135/82 167/82  Pulse:  75 76 62  Temp: 98.6 F (37 C)  98.1 F (36.7 C) 97.7 F (36.5 C)  TempSrc:   Oral Oral  Resp:  18 18 18   SpO2:  99% 99% 98%   Blood pressure 167/82, pulse 62, temperature 97.7 F (36.5 C), temperature source Oral, resp. rate 18, SpO2 98.00%. PSYCH: He is alert and oriented x4; does not appear anxious does not appear depressed; affect is normal HEENT: Normocephalic and Atraumatic, Mucous membranes pink; PERRLA; Resting ABDUCTION IN BOTH EYES,  Fundi:  Benign;  No scleral icterus, Nares: Patent, Oropharynx: Clear, Fair Dentition, Neck:  FROM, no cervical lymphadenopathy nor thyromegaly or carotid bruit; no JVD; Breasts:: Not examined CHEST WALL: No tenderness CHEST: Normal respiration, clear to auscultation bilaterally HEART: Regular rate and rhythm; no murmurs rubs or gallops BACK: No kyphosis or scoliosis; no CVA tenderness ABDOMEN: Positive Bowel Sounds, Soft non-tender; no masses, no organomegaly,  Rectal Exam: Not done EXTREMITIES: No bone or joint deformity; age-appropriate arthropathy of the hands and knees; no cyanosis, clubbing or edema; no ulcerations. Genitalia: not examined PULSES: 2+ and symmetric SKIN: Normal hydration no rash or ulceration CNS: Cranial nerve IV palsy present,  All other Cranial Nerves are intact, No Pronator drifting, Able to move all 4 extremities, cerebellar function intact, Gait was not assessed.  Reflexes intact.   Labs & Imaging Results for orders placed during the hospital  encounter of 05/01/11 (from the past 48 hour(s))  CBC     Status: Abnormal   Collection Time   05/01/11  3:32 PM      Component Value Range Comment   WBC 6.8  4.0 - 10.5 (K/uL)    RBC 4.36  4.22 - 5.81 (MIL/uL)    Hemoglobin 12.3 (*) 13.0 - 17.0 (g/dL)    HCT 96.0 (*) 45.4 - 52.0 (%)    MCV 84.4  78.0 - 100.0 (fL)    MCH 28.2  26.0 - 34.0 (pg)    MCHC 33.4  30.0 - 36.0 (g/dL)    RDW 09.8  11.9 - 14.7 (%)    Platelets 158  150 - 400 (K/uL)   BASIC METABOLIC PANEL     Status: Abnormal   Collection Time   05/01/11  3:32 PM      Component Value Range  Comment   Sodium 140  135 - 145 (mEq/L)    Potassium 4.3  3.5 - 5.1 (mEq/L)    Chloride 103  96 - 112 (mEq/L)    CO2 29  19 - 32 (mEq/L)    Glucose, Bld 211 (*) 70 - 99 (mg/dL)    BUN 11  6 - 23 (mg/dL)    Creatinine, Ser 4.54  0.50 - 1.35 (mg/dL)    Calcium 09.8  8.4 - 10.5 (mg/dL)    GFR calc non Af Amer 85 (*) >90 (mL/min)    GFR calc Af Amer >90  >90 (mL/min)    Ct Head Wo Contrast  05/01/2011  *RADIOLOGY REPORT*  Clinical Data: Blurred vision since this morning.  History of stroke.  CT HEAD WITHOUT CONTRAST  Technique:  Contiguous axial images were obtained from the base of the skull through the vertex without contrast.  Comparison: 02/07/2011  Findings: There is mild central cortical atrophy.  Periventricular white matter changes are consistent with small vessel disease. There is no evidence for hemorrhage, mass lesion, or acute infarction.  Scattered small lacunar infarcts in the basal ganglia are chronic.  Bone windows are unremarkable.  IMPRESSION:  1.  Atrophy and small vessel disease. 2. No evidence for acute intracranial abnormality.  Original Report Authenticated By: Patterson Hammersmith, M.D.   [facility] Head Wo Contrast  05/01/2011  *RADIOLOGY REPORT*  Clinical Data:  Diplopia  MRI HEAD WITHOUT CONTRAST MRA HEAD WITHOUT CONTRAST  Technique:  Multiplanar, multiecho pulse sequences of the brain and surrounding structures were obtained  without intravenous contrast. Angiographic images of the head were obtained using MRA technique without contrast.  Comparison:  CT 05/01/2011  MRI HEAD  Findings:  Small area of acute infarction in the right mid brain causing the  diplopia.  No other acute infarct.  Generalized atrophy.  Chronic ischemic changes are present in the white matter bilaterally.  Chronic infarct in the left thalamus and basal ganglia bilaterally.  Mild chronic ischemia in the pons. Negative for hemorrhage or mass lesion.  IMPRESSION: Acute infarct in the right mid brain accounting for diplopia.  MRA HEAD  Findings: Distal right vertebral artery is not visualized and may be occluded or hypoplastic.  Left vertebral artery and basilar are patent.  Prominent right AICA is patent.  Left AICA not seen.  Left PICA not visualized.  Superior cerebellar and posterior cerebral arteries are patent bilaterally.  Fetal origin of the left posterior cerebral artery.  Mild disease in the posterior cerebral arteries bilaterally.  Internal carotid artery is patent bilaterally.  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  The anterior and middle cerebral arteries are patent bilaterally.  Diffuse mild atherosclerotic disease is present bilaterally.  No aneurysm.  IMPRESSION: Mild  intracranial atherosclerotic disease. This appears more prominent than on the recent MRI of 02/08/2011 which may be related to motion artifact on the current study.  Right vertebral artery is not visualized and may be occluded.  This is unchanged from the MRA 02/08/2011  Original Report Authenticated By: Camelia Phenes, M.D.   Mr Brain Wo Contrast  05/01/2011  *RADIOLOGY REPORT*  Clinical Data:  Diplopia  MRI HEAD WITHOUT CONTRAST MRA HEAD WITHOUT CONTRAST  Technique:  Multiplanar, multiecho pulse sequences of the brain and surrounding structures were obtained without intravenous contrast. Angiographic  images of the head were obtained using MRA technique without contrast.  Comparison:  CT  05/01/2011  MRI HEAD  Findings:  Small area of acute infarction in the right mid brain causing the  diplopia.  No other acute infarct.  Generalized atrophy.  Chronic ischemic changes are present in the white matter bilaterally.  Chronic infarct in the left thalamus and basal ganglia bilaterally.  Mild chronic ischemia in the pons. Negative for hemorrhage or mass lesion.  IMPRESSION: Acute infarct in the right mid brain accounting for diplopia.  MRA HEAD  Findings: Distal right vertebral artery is not visualized and may be occluded or hypoplastic.  Left vertebral artery and basilar are patent.  Prominent right AICA is patent.  Left AICA not seen.  Left PICA not visualized.  Superior cerebellar and posterior cerebral arteries are patent bilaterally.  Fetal origin of the left posterior cerebral artery.  Mild disease in the posterior cerebral arteries bilaterally.  Internal carotid artery is patent bilaterally.  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  The anterior and middle cerebral arteries are patent bilaterally.  Diffuse mild atherosclerotic disease is present bilaterally.  No aneurysm.  IMPRESSION: Mild  intracranial atherosclerotic disease. This appears more prominent than on the recent MRI of 02/08/2011 which may be related to motion artifact on the current study.  Right vertebral artery is not visualized and may be occluded.  This is unchanged from the MRA 02/08/2011  Original Report Authenticated By: Camelia Phenes, M.D.      Assessment/Plan: Present on Admission:  .CVA (cerebral infarction) .HTN (hypertension) .Type 2 diabetes mellitus with hyperglycemia .CARDIOMYOPATHY .HYPERLIPIDEMIA .Diplopia   Plan:    Admit to Telemetry Bed Ischemic CVA Protocol Neuro Checks Neuro Saw recommended continuing Plavix PT/OT/Speech  Evaluations SSI Coverage PRN Reconcile Meds DVT prophylaxis Other plans as per orders.     CODE STATUS:      FULL CODE        Tersa Fotopoulos C 05/01/2011, 9:11 PM

## 2011-05-01 NOTE — ED Provider Notes (Addendum)
History    66 year old male with diplopia. Patient states he woke up this morning with double vision. When he went to bed last night he was in his usual state of health. Since this morning symptoms have been stable. Patient denies pain anywhere. Denies headaches or eye pain. Denies any tearing. No numbness, tingling or loss of strength. Patient does have a history of prior CVA with some mild residual dysarthria. Does not wear contacts. When he covers each eye individually he does not see double. Denies change in visual acuity.    CSN: [id number]  Arrival date & time 05/01/11  1313   First MD Initiated Contact with Patient 05/01/11 1450      Chief Complaint  Patient presents with  . Diplopia    (Consider location/radiation/quality/duration/timing/severity/associated sxs/prior treatment) HPI  Past Medical History  Diagnosis Date  . DM (diabetes mellitus)     x8 years  . HTN (hypertension)   . HLD (hyperlipidemia)   . CAD (coronary artery disease)     cypher stenting to the circ and occluded RCA 2003.   . Cardiomyopathy     EF 45%  . Stroke     Past Surgical History  Procedure Date  . Coronary angioplasty with stent placement     Family History  Problem Relation Age of Onset  . Stroke      family hx  . Diabetes      family hx  . Hypertension      family hx     History  Substance Use Topics  . Smoking status: Former Games developer  . Smokeless tobacco: Not on file  . Alcohol Use: No      Review of Systems   Review of symptoms negative unless otherwise noted in HPI.   Allergies  Review of patient's allergies indicates no known allergies.  Home Medications   Current Outpatient Rx  Name Route Sig Dispense Refill  . ATORVASTATIN CALCIUM 80 MG PO TABS Oral Take 40 mg by mouth at bedtime. For cholesterol    . CLOPIDOGREL BISULFATE 75 MG PO TABS Oral Take 75 mg by mouth daily.    Marland Kitchen VITAMIN B-12 IJ Injection Inject 1,000 mcg/kg/hr as directed every 30 (thirty) days.  On 1st day of every month    . ERGOCALCIFEROL 50000 UNITS PO CAPS Oral Take 50,000 Units by mouth once a week. On Fridays    . INSULIN GLARGINE 100 UNIT/ML  SOLN Subcutaneous Inject 22 Units into the skin at bedtime.    . INSULIN REGULAR HUMAN 100 UNIT/ML IJ SOLN Subcutaneous Inject into the skin See admin instructions. Inject subcutaneously based on sliding scale daily - low dose    . LISINOPRIL 40 MG PO TABS Oral Take 20 mg by mouth daily.    Marland Kitchen METFORMIN HCL 1000 MG PO TABS Oral Take 1,000 mg by mouth 2 (two) times daily with a meal.    . FISH OIL 1000 MG PO CAPS Oral Take 1,000 mg by mouth daily.    Marland Kitchen OMEPRAZOLE 20 MG PO CPDR Oral Take 20 mg by mouth daily.     Marland Kitchen PAROXETINE HCL 40 MG PO TABS Oral Take 40 mg by mouth every morning.     Marland Kitchen VITAMIN B-1 100 MG PO TABS Oral Take 100 mg by mouth daily.     . STROKE: MAPPING OUR EARLY STAGES OF RECOVERY BOOK Does not apply 1 each by Does not apply route once. 1 each 0  . INDOMETHACIN ER 75 MG PO CPCR Oral  Take 75 mg by mouth as needed. For gout flare ups      BP 151/78  Pulse 75  Temp(Src) 98.6 F (37 C) (Oral)  Resp 18  SpO2 99%  Physical Exam  Nursing note and vitals reviewed. Constitutional: He is oriented to person, place, and time. He appears well-developed and well-nourished. No distress.  HENT:  Head: Normocephalic and atraumatic.  Eyes: Conjunctivae are normal. Pupils are equal, round, and reactive to light. Right eye exhibits no discharge. Left eye exhibits no discharge. No scleral icterus.       Corneas clear. Slight L eye ptosis, periorbital tissues otherwise wnl. Pupils equally round and reactive to light. Disconjugate gaze. L eye slightly abducted and infraducted. No proptosis.  Neck: Normal range of motion. Neck supple.  Cardiovascular: Normal rate, regular rhythm and normal heart sounds.  Exam reveals no gallop and no friction rub.   No murmur heard. Pulmonary/Chest: Effort normal and breath sounds normal. No respiratory  distress.  Abdominal: Soft. He exhibits no distension. There is no tenderness.  Musculoskeletal: He exhibits no edema and no tenderness.  Lymphadenopathy:    He has no cervical adenopathy.  Neurological: He is alert and oriented to person, place, and time. A cranial nerve deficit is present.       L pupil slightly abducted and infraducted. Slight L ptosis.  CN otherwise intact. Speech clear and content appropriate. Strength 5/5 u/l ext. Good finger to nose testing b/l.  Skin: Skin is warm and dry.  Psychiatric: He has a normal mood and affect. His behavior is normal. Thought content normal.    ED Course  Procedures (including critical care time)   Labs Reviewed  CBC  BASIC METABOLIC PANEL   Ct Head Wo Contrast  05/01/2011  *RADIOLOGY REPORT*  Clinical Data: Blurred vision since this morning.  History of stroke.  CT HEAD WITHOUT CONTRAST  Technique:  Contiguous axial images were obtained from the base of the skull through the vertex without contrast.  Comparison: 02/07/2011  Findings: There is mild central cortical atrophy.  Periventricular white matter changes are consistent with small vessel disease. There is no evidence for hemorrhage, mass lesion, or acute infarction.  Scattered small lacunar infarcts in the basal ganglia are chronic.  Bone windows are unremarkable.  IMPRESSION:  1.  Atrophy and small vessel disease. 2. No evidence for acute intracranial abnormality.  Original Report Authenticated By: Patterson Hammersmith, M.D.   Mr [facility] Head Wo Contrast  05/01/2011  *RADIOLOGY REPORT*  Clinical Data:  Diplopia  MRI HEAD WITHOUT CONTRAST MRA HEAD WITHOUT CONTRAST  Technique:  Multiplanar, multiecho pulse sequences of the brain and surrounding structures were obtained without intravenous contrast. Angiographic images of the head were obtained using MRA technique without contrast.  Comparison:  CT 05/01/2011  MRI HEAD  Findings:  Small area of acute infarction in the right mid brain causing the   diplopia.  No other acute infarct.  Generalized atrophy.  Chronic ischemic changes are present in the white matter bilaterally.  Chronic infarct in the left thalamus and basal ganglia bilaterally.  Mild chronic ischemia in the pons. Negative for hemorrhage or mass lesion.  IMPRESSION: Acute infarct in the right mid brain accounting for diplopia.  MRA HEAD  Findings: Distal right vertebral artery is not visualized and may be occluded or hypoplastic.  Left vertebral artery and basilar are patent.  Prominent right AICA is patent.  Left AICA not seen.  Left PICA not visualized.  Superior cerebellar and posterior  cerebral arteries are patent bilaterally.  Fetal origin of the left posterior cerebral artery.  Mild disease in the posterior cerebral arteries bilaterally.  Internal carotid artery is patent bilaterally.  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  The anterior and middle cerebral arteries are patent bilaterally.  Diffuse mild atherosclerotic disease is present bilaterally.  No aneurysm.  IMPRESSION: Mild  intracranial atherosclerotic disease. This appears more prominent than on the recent MRI of 02/08/2011 which may be related to motion artifact on the current study.  Right vertebral artery is not visualized and may be occluded.  This is unchanged from the MRA 02/08/2011  Original Report Authenticated By: Camelia Phenes, M.D.   Mr Brain Wo Contrast  05/01/2011  *RADIOLOGY REPORT*  Clinical Data:  Diplopia  MRI HEAD WITHOUT CONTRAST MRA HEAD WITHOUT CONTRAST  Technique:  Multiplanar, multiecho pulse sequences of the brain and surrounding structures were obtained without intravenous contrast. Angiographic images of the head were obtained using MRA technique without contrast.  Comparison:  CT 05/01/2011  MRI HEAD  Findings:  Small area of acute infarction in the right mid brain causing the  diplopia.  No other acute infarct.   Generalized atrophy.  Chronic ischemic changes are present in the white matter bilaterally.  Chronic infarct in the left thalamus and basal ganglia bilaterally.  Mild chronic ischemia in the pons. Negative for hemorrhage or mass lesion.  IMPRESSION: Acute infarct in the right mid brain accounting for diplopia.  MRA HEAD  Findings: Distal right vertebral artery is not visualized and may be occluded or hypoplastic.  Left vertebral artery and basilar are patent.  Prominent right AICA is patent.  Left AICA not seen.  Left PICA not visualized.  Superior cerebellar and posterior cerebral arteries are patent bilaterally.  Fetal origin of the left posterior cerebral artery.  Mild disease in the posterior cerebral arteries bilaterally.  Internal carotid artery is patent bilaterally.  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  The anterior and middle cerebral arteries are patent bilaterally.  Diffuse mild atherosclerotic disease is present bilaterally.  No aneurysm.  IMPRESSION: Mild  intracranial atherosclerotic disease. This appears more prominent than on the recent MRI of 02/08/2011 which may be related to motion artifact on the current study.  Right vertebral artery is not visualized and may be occluded.  This is unchanged from the MRA 02/08/2011  Original Report Authenticated By: Camelia Phenes, M.D.    3:41 PM previous imaging studies reviewed. Mr Angiogram Head Wo Contrast  02/08/2011 IMPRESSION: 1. Negative anterior circulation. 2. No flow signal in the distal right vertebral artery which could be either occluded (favored) or supplied in a retrograde fashion from the left. A dominant right AICA is patent.  [name] Wo Contrast  02/07/2011 IMPRESSION: 1. No acute non hemorrhagic infarct of the left lentiform nucleus with superior extension to the corona radiata. 2. Multiple other remote lacunar infarcts of the basal ganglia  bilaterally. 3. Age advanced atrophy and extensive white matter disease. This likely reflects the sequelae of chronic microvascular ischemia. 4. Abnormal signal in the right vertebral artery, suggesting occlusion.    1. Oculomotor nerve palsy, left eye   2. Diplopia   3. Brain Stem Infarction    MDM  65yM with binocular diplopia. On exam has pupil sparing L oculomotor nerve palsy. Pt was started on plavix  and ASA stopped after stroke 3 months ago.Multiple stroke riisk factors including FH, previous stroke, DM, HTN, HLD. Had MR in December which did not show evidence of berry aneursym. No proptosis. No infectious symptoms. No hx of thyroid disease.  CT scan today did not show any acute abnormalities. Neurology was consulted who recommended MRI. MR today was consistent with a small brainstem stroke. Aside from the aforementioned, patient has no other acute deficits. He is clinically stable and protecting his airway. Admission for further evaluation.       Raeford Razor, MD 05/02/11 1610  Raeford Razor, MD 05/02/11 0040

## 2011-05-01 NOTE — ED Notes (Signed)
Pt in MRI at the time. 

## 2011-05-01 NOTE — ED Notes (Signed)
Patient walked to restroom and back to room with steady gait

## 2011-05-01 NOTE — ED Notes (Signed)
Attempted to call report. Nurse not available. She will call me back

## 2011-05-01 NOTE — ED Notes (Signed)
Pt returned to exam room. 

## 2011-05-01 NOTE — ED Notes (Signed)
Pt presents to department for evaluation of diplopia. States he woke up this morning and has been seeing "double" of everything. States this makes him dizzy and unable to walk straight. Denies headache. Strong equal bilateral grip strengths. No facial droop. Pt states slurred speech from previous stroke in December, but no new speaking issues. Able to move all extremities. Pupils round and reactive. Swelling noted to both eyes, pt states "my eyes feel puffy." he is conscious alert and oriented x4. Able to answer questions appropriately. Denies pain.

## 2011-05-01 NOTE — ED Notes (Signed)
Neurologist at the bedside. Pt remains alert and oriented x4. Vital signs stable. No signs of distress noted at the time.

## 2011-05-01 NOTE — ED Notes (Addendum)
Pt states that he woke up this am with blurred vision. Denies any headache. No difficulty walking. Pt states his speech is normal from his stroke in December. Pt states he is not able to grab things or walk straight because of his vision.

## 2011-05-02 ENCOUNTER — Inpatient Hospital Stay (HOSPITAL_COMMUNITY): Payer: Non-veteran care

## 2011-05-02 LAB — LIPID PANEL
HDL: 36 mg/dL — ABNORMAL LOW (ref >39)
LDL Cholesterol: 89 mg/dL (ref 0–99)
Triglycerides: 176 mg/dL — ABNORMAL HIGH (ref <150)
VLDL: 35 mg/dL (ref 0–40)

## 2011-05-02 LAB — CBC
HCT: 39 % (ref 39.0–52.0)
MCHC: 33.3 g/dL (ref 30.0–36.0)
MCV: 84.6 fL (ref 78.0–100.0)
RDW: 13.9 % (ref 11.5–15.5)

## 2011-05-02 LAB — HEMOGLOBIN A1C
Hgb A1c MFr Bld: 8.2 % — ABNORMAL HIGH (ref <5.7)
Mean Plasma Glucose: 192 mg/dL — ABNORMAL HIGH (ref <117)

## 2011-05-02 LAB — CREATININE, SERUM: GFR calc non Af Amer: 88 mL/min — ABNORMAL LOW (ref >90)

## 2011-05-02 MED ORDER — ASPIRIN-DIPYRIDAMOLE ER 25-200 MG PO CP12
1.0000 | ORAL_CAPSULE | Freq: Every day | ORAL | Status: DC
  Filled 2011-05-02: qty 1

## 2011-05-02 MED ORDER — ASPIRIN 81 MG PO CHEW: 81.0000 mg | CHEWABLE_TABLET | Freq: Every day | ORAL | Status: AC

## 2011-05-02 MED ORDER — ASPIRIN 81 MG PO CHEW: 81.0000 mg | CHEWABLE_TABLET | Freq: Once | ORAL | Status: DC

## 2011-05-02 MED ORDER — ASPIRIN-DIPYRIDAMOLE ER 25-200 MG PO CP12: 1.0000 | ORAL_CAPSULE | Freq: Two times a day (BID) | ORAL | Status: AC

## 2011-05-02 NOTE — Progress Notes (Signed)
History: Timothy Warner is an 66 y.o. male with double vision since this morning (05/01/11). He woke up with symptoms. He denies having double vision when he closes one of his eyes. He recently had strokes with residual swallowing dysfunction.  LSN: before midnight 04/30/11 tPA Given: No: presented outside tPA window.  Subjective: Patient continues to experience side-side diploplia in all visual fields with both eyes open.  Resolves with one eye closed.  No nausea or vomiting.  Denies dizziness, headache, extremity weakness, palpitations or dysphagia.  Objective: BP 135/76  Pulse 58  Temp(Src) 97.7 F (36.5 C) (Oral)  Resp 18  Ht 5\' 9"  (1.753 m)  Wt 81.647 kg (180 lb)  BMI 26.58 kg/m2  SpO2 95%   CBGs  Basename 05/02/11 0702 05/01/11 2249  GLUCAP 118* 81    Diet: CHO modified, thin  Activity: up with assistance  DVT Prophylaxis: lovenox  Medications: Scheduled:   .  stroke: mapping our early stages of recovery book  1 each Does not apply Once  . sodium chloride   Intravenous STAT  . atorvastatin  40 mg Oral QHS  . clopidogrel  75 mg Oral Daily  . enoxaparin  40 mg Subcutaneous Q24H  . insulin aspart  0-5 Units Subcutaneous QHS  . insulin aspart  0-9 Units Subcutaneous TID WC  . insulin glargine  22 Units Subcutaneous QHS  . lisinopril  20 mg Oral Daily  . omega-3 acid ethyl esters  1 g Oral Q breakfast  . pantoprazole  40 mg Oral Q1200  . PARoxetine  40 mg Oral q morning - 10a  . thiamine  100 mg Oral Daily  . Vitamin D (Ergocalciferol)  50,000 Units Oral Q Fri    Neurologic Exam: Mental Status: Alert, oriented, thought content appropriate.  Speech fluent without evidence of aphasia. Able to follow 3 step commands without difficulty. Cranial Nerves: II- Visual fields grossly intact- with eyes open and either closed.  Diploplia in a side by side orientation with both eyes open, resolves with one closed. III/IV/VI-disconjugate forward gaze with OS deviated slightly  superiolaterally.  Extraocular movements are severely restricted in the vertical plane. Cover test is positive, with horizontal eye movement.  Pupils reactive bilaterally. V/VII-Smile symmetric VIII-hearing grossly intact IX/X-normal gag XI-bilateral shoulder shrug XII-midline tongue extension Motor: 5/5 bilaterally with normal tone and bulk Sensory: Light touch intact throughout, bilaterally Deep Tendon Reflexes: 2+ and symmetric throughout Plantars: Downgoing bilaterally Cerebellar: Normal finger-to-nose, normal rapid alternating movements and normal heel-to-shin test.   Lab Results:  CBC:  Lab 05/02/11 0003 05/01/11 1532  WBC 7.7 6.8  NEUTROABS -- --  HGB 13.0 12.3*  HCT 39.0 36.8*  MCV 84.6 84.4  PLT 166 158   Basic Metabolic Panel:  Lab 05/02/11 1191 05/01/11 1532  NA -- 140  K -- 4.3  CL -- 103  CO2 -- 29  GLUCOSE -- 211*  BUN -- 11  CREATININE 0.89 0.99  CALCIUM -- 10.0  MG -- --  PHOS -- --   LIPIDS: Lab 05/02/11 0625  CHOL 160  HDL 36*  LDLCALC 89  TRIG 478*  CHOLHDL 4.4  LDLDIRECT --    Study Results:  05/02/2011  CHEST - 2 VIEW Findings: Heart size appears normal.  Asymmetric elevation of the right hemidiaphragm is noted.  This is unchanged from previous exam.  There is no airspace consolidation.  No edema or effusions identified.  IMPRESSION:  1.  Chronic asymmetric elevation of the right hemidiaphragm. 2.  No  acute findings.  Rosealee Albee, M.D.   05/01/2011 CT HEAD WITHOUT CONTRAST    Findings: There is mild central cortical atrophy.  Periventricular white matter changes are consistent with small vessel disease. There is no evidence for hemorrhage, mass lesion, or acute infarction.  Scattered small lacunar infarcts in the basal ganglia are chronic.  Bone windows are unremarkable.  IMPRESSION:  1.  Atrophy and small vessel disease. 2. No evidence for acute intracranial abnormality.   Patterson Hammersmith, M.D.   05/01/2011  MRI HEAD WITHOUT CONTRAST  MRI  HEAD  Findings:  Small area of acute infarction in the right mid brain causing the  diplopia.  No other acute infarct.  Generalized atrophy.  Chronic ischemic changes are present in the white matter bilaterally.  Chronic infarct in the left thalamus and basal ganglia bilaterally.  Mild chronic ischemia in the pons. Negative for hemorrhage or mass lesion.  IMPRESSION: Acute infarct in the right mid brain accounting for diplopia.  Camelia Phenes, M.D.   05/01/2011 MRA HEAD  Findings: Distal right vertebral artery is not visualized and may be occluded or hypoplastic.  Left vertebral artery and basilar are patent.  Prominent right AICA is patent.  Left AICA not seen.  Left PICA not visualized.  Superior cerebellar and posterior cerebral arteries are patent bilaterally.  Fetal origin of the left posterior cerebral artery.  Mild disease in the posterior cerebral arteries bilaterally.  Internal carotid artery is patent bilaterally.  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  The anterior and middle cerebral arteries are patent bilaterally.  Diffuse mild atherosclerotic disease is present bilaterally.  No aneurysm.  IMPRESSION: Mild  intracranial atherosclerotic disease. This appears more prominent than on the recent MRI of 02/08/2011 which may be related to motion artifact on the current study.  Right vertebral artery is not visualized and may be occluded.  This is unchanged from the MRA 02/08/2011 Camelia Phenes, M.D.   02/08/11 Carotid Dopplers:  no ICA stenosis.  02/09/11 2D Echo: Left ventricle: Hypokinesis at the base of the inferior wall. Mild LVH. The estimated ejection fraction was 55%. Doppler parameters are consistent with grade 1 diastolic dysfunction. - Aortic valve: Mild regurgitation. - Mitral valve: Mild regurgitation. - Impressions: No cardiac source of embolism was identified, but cannot be ruled out on the basis of this  examination. Impressions: No cardiac source of embolism was identified  Assessment: 1. Acute infarct in the right mid brain accounting for diplopia, on plavix.  2. Recent CVA-02/07/11: acute non hemorrhagic infarct of the left lentiform nucleus with superior extension to the corona radiata.  Multiple other remote lacunar infarcts of the basal ganglia bilaterally. 3. HTN- controlled at present. 4. Hyperlipidemia-not at goal <80.  LDL 89 on lipitor 80. 5. Diabetes Mellitus- uncontrolled: A1C was 9.9 12/12. 6. CAD- asymptomatic.  Stroke Risk Factors: CAD, CVA, HTN, DM, Hyperlipidemia  After review of the MRI study, there appears to be a punctate infarct in the mid brain that is the etiology of his double vision. The patient likely has small vessel disease that is the etiology of this occurrence. Embolic stroke is not suspected. The patient at this time wants to go home, and he should be up to function fairly well at home.   Plan:  1. Consider aggrenox for secondary stroke prevention. Would place patient on one Aggrenox daily with a baby aspirin daily for 2 weeks, and then  convert to Aggrenox, one tablet twice daily. The baby aspirin will be discontinued at that time. 2. Okay for discharge to home from neuro standpoint 3. PT,OT,ST. 4. Patch one eye to manage diploplia. 5. Aggressive risk factor reduction.  LOS: 1 day  Marya Fossa PA-C [facility] [phone] 05/02/2011  8:30 AM  Lesly Dukes

## 2011-05-02 NOTE — Discharge Summary (Signed)
Timothy Warner, 66 y.o., DOB Sep 14, 1945, MRN [id number]. Admission date: 05/01/2011 Discharge Date 05/02/2011 Primary MD No primary provider on file. 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 DM (diabetes mellitus)     x8 years  . HTN (hypertension)   . HLD (hyperlipidemia)   . CAD (coronary artery disease)     cypher stenting to the circ and occluded RCA 2003.   . Cardiomyopathy     EF 45%  . Stroke     Past Surgical History  Procedure Date  . Coronary angioplasty with stent placement     Brief History and Physical Timothy Warner is an 66 y.o. male Who presented to the ED with complaints of double vision since awakening in the AM. He also reported having a slight headache, but denied any weakness or chest pain or palpitations or SOB. He denied having any nausea or vomiting. He denied any weakness of one side of his body and denied ataxia or problems. He did report vertigo and sight problems due to the diplopia. The symptoms did not get better and several hours later he came to the ED and was outside of the window for treatment with TPA. A CT scan of the Head was performed with negative results, however, a subsequent MRI Study was performed and revealed a small brainstem infarct. Neuro was consulted. Patient was referred for medical admission. Of note, patient had a recent CVA in 01/2011 with residual  deficits of dysarthria which has been improving. Neurology has cleared the patient for discharge home today  Hospital Course  Acute Right midbrain infarct   Patient was admitted and the diagnosis of acute right midbrain infarct of double vision. Patient already has a patch on one eye, neurology has seen the patient, and recommend to start Aggrenox 1 tablet by mouth daily for 2 weeks then changed to 1 tablet by mouth twice a day. Recommendation is also to start a baby aspirin 81 mg by mouth daily for 2 weeks and then stop taking it. Patient was taking Plavix before, which we will stop  as per neuro recommendations. Patient will need outpatient physical therapy for the diplopia.  Diabetes mellitus  Patient will continue to use Lantus and sliding scale insulin for diabetes mellitus  Hypertension  Continue lisinopril   Hyperlipidemia: Continue atorvastatin    Consults  Neurology  Significant Tests:  See full reports for all details    Dg Chest 2 View  05/02/2011   IMPRESSION:  1.  Chronic asymmetric elevation of the right hemidiaphragm. 2.  No acute findings.  Original   Ct Head Wo Contrast  05/01/2011   IMPRESSION:  1.  Atrophy and small vessel disease. 2. No evidence for acute intracranial abnormality.  Original   Mr Maxine Glenn Head Wo Contrast  05/01/2011  *RADIOLOGY REPORT*  Clinical Data:  Diplopia  MRI HEAD WITHOUT CONTRAST MRA HEAD WITHOUT CONTRAST  Technique:  Multiplanar, multiecho pulse sequences of the brain and surrounding structures were obtained without intravenous contrast. Angiographic images  of the head were obtained using MRA technique without contrast.   Comparison:  CT 05/01/2011   MRI HEAD  Findings:  Small area of acute infarction in the right mid brain causing the  diplopia.  No other acute infarct.  Generalized atrophy.  Chronic ischemic changes are present in the white matter bilaterally.  Chronic infarct in the left thalamus and basal ganglia bilaterally.  Mild chronic  ischemia in the pons. Negative for hemorrhage or mass lesion.  IMPRESSION: Acute infarct in the right mid brain accounting for diplopia.    MRA HEAD    IMPRESSION: Mild  intracranial atherosclerotic disease. This appears more prominent than on the recent MRI of 02/08/2011 which may be related to motion artifact on the current study.  Right vertebral artery is not visualized and may be occluded.  This is unchanged from the MRA 02/08/2011  Original Report Authenticated By: Camelia Phenes, M.D.       Today   Subjective:   Timothy Warner today has no headache,no chest abdominal pain,no new weakness.  Objective:   Blood pressure 156/82, pulse 68, temperature 97.7 F (36.5 C), temperature source Oral, resp. rate 18, height 5\' 9"  (1.753 m), weight 81.647 kg (180 lb), SpO2 95.00%.  Intake/Output Summary (Last 24 hours) at 05/02/11 1211 Last data filed at 05/02/11 1610  Gross per 24 hour  Intake      0 ml  Output    250 ml  Net   -250 ml    Exam Awake Alert, Oriented *3,  Normal affect Timothy Warner.AT,PERRAL Supple Neck,No JVD, No cervical lymphadenopathy appriciated.  Symmetrical Chest wall movement, Good air movement bilaterally, CTAB RRR,No Gallops,Rubs or new Murmurs, No Parasternal Heave +ve B.Sounds, Abd Soft, Non tender, No organomegaly appriciated, No rebound -guarding or rigidity. No Cyanosis, Clubbing or edema, No new Rash or bruise  Data Review   Cultures -   CBC w Diff: Lab Results  Component Value Date   WBC 7.7 05/02/2011   HGB 13.0 05/02/2011   HCT 39.0 05/02/2011   PLT 166 05/02/2011   CMP: Lab Results  Component Value Date   NA 140 05/01/2011   K 4.3 05/01/2011   CL 103 05/01/2011   CO2 29 05/01/2011   BUN 11 05/01/2011   CREATININE 0.89 05/02/2011  .         Discharge Medications   Medication List  As of 05/02/2011 12:11 PM   START taking these medications         aspirin 81 MG chewable tablet   Chew 1 tablet (81 mg total) by mouth daily.      dipyridamole-aspirin  25-200 MG per 12 hr capsule   Commonly known as: AGGRENOX   Take 1 capsule by mouth 2 (two) times daily. Take one tablet po daily for two weeks , then start taking one tablet one po twice daily.         CONTINUE taking these medications          stroke: mapping our early stages of recovery book Misc   1 each by Does not apply route once.      atorvastatin 80 MG tablet   Commonly known as: LIPITOR      ergocalciferol 50000 UNITS capsule   Commonly known as: VITAMIN D2      Fish Oil 1000 MG Caps      indomethacin 75 MG CR capsule   Commonly known as: INDOCIN SR      insulin glargine 100 UNIT/ML injection  Commonly known as: LANTUS      insulin regular 100 units/mL injection   Commonly known as: NOVOLIN R,HUMULIN R      lisinopril 40 MG tablet   Commonly known as: PRINIVIL,ZESTRIL      LORazepam 0.5 MG tablet   Commonly known as: ATIVAN      metFORMIN 1000 MG tablet   Commonly known as: GLUCOPHAGE      omeprazole 20 MG capsule   Commonly known as: PRILOSEC      PARoxetine 40 MG tablet   Commonly known as: PAXIL      thiamine 100 MG tablet   Commonly known as: VITAMIN B-1      VITAMIN B-12 IJ         STOP taking these medications         clopidogrel 75 MG tablet          Where to get your medications    These are the prescriptions that you need to pick up. We sent them to a specific pharmacy, so you will need to go there to get them.   [id number] - [place], [place] - [address]    [address] [place] [address]    Phone: [phone]        aspirin 81 MG chewable tablet         You may get these medications from any pharmacy.         dipyridamole-aspirin 25-200 MG per 12 hr capsule             Total Time in preparing paper work, data evaluation and todays exam - 35 minutes  Timothy Warner S M.D on 05/02/2011 at 12:11 PM  [facility] Office  [phone]

## 2011-05-02 NOTE — Progress Notes (Signed)
VASCULAR LAB PRELIMINARY  PRELIMINARY  PRELIMINARY  PRELIMINARY   Patient had Carotid Dopplers 02/08/11 that showed no ICA stenosis.  The vascular lab generally does not repeat normal Carotid studies within a 6 month period.  Please advise if you would like the study repeated.  Sherren Kerns McDonald, 05/02/2011, 7:23 AM

## 2011-05-02 NOTE — Progress Notes (Signed)
Physical Therapy Evaluation Patient Details Name: TREYTEN MONESTIME MRN: [id number] DOB: 1946/01/30 Today's Date: 05/02/2011  Problem List:  Patient Active Problem List  Diagnoses  . DM  . HYPERLIPIDEMIA  . HYPERTENSION  . CARDIOMYOPATHY  . ERECTILE DYSFUNCTION, ORGANIC  . CVA (cerebral infarction)  . Dysarthria due to cerebrovascular accident  . Type 2 diabetes mellitus with hyperglycemia  . CAD (coronary artery disease)  . DM (diabetes mellitus)  . Cardiomyopathy  . Stroke  . HTN (hypertension)  . Diplopia  . H/O: stroke with residual effects    Past Medical History:  Past Medical History  Diagnosis Date  . DM (diabetes mellitus)     x8 years  . HTN (hypertension)   . HLD (hyperlipidemia)   . CAD (coronary artery disease)     cypher stenting to the circ and occluded RCA 2003.   . Cardiomyopathy     EF 45%  . Stroke    Past Surgical History:  Past Surgical History  Procedure Date  . Coronary angioplasty with stent placement     PT Assessment/Plan/Recommendation PT Assessment Clinical Impression Statement: 66 yo patient presents with visual and vestibular dysfunction status post mid brain infarct.  Diplopia corrected by single eye patching, recommended to MD to order wearing schedule to switch eyes every 2 hours or so during daytime wearing.  OT recomended to further evaluate.  Vestibular dysfunction presents as dysequillibrium with dynamic walking and head turns especially to RIGHT and DOWNWARD.  Pt report transient blurry vision following ambulation with head turns and increased postural instability.  DIscussed with patient plan to pursue OPPT for vestibular rehab, to undergo acute OT evaluation for functional vision, to obtain MD order for eye patch wearing schedule.  Pt agreeable to plan.  WIll continue to follow acutely. PT Recommendation/Assessment: Patient will need skilled PT in the acute care venue PT Problem List: Decreased balance;Decreased mobility;Other  (comment) (vestibular impairment with decr gaze and postural stability) Barriers to Discharge: None PT Therapy Diagnosis :  (Vestibular dysfunction of central origin) PT Plan PT Frequency: Min 4X/week PT Treatment/Interventions: Gait training;Functional mobility training;Neuromuscular re-education;Wheelchair mobility training;Other (comment) (gaze stability retraining, postural stability retraining) PT Recommendation Recommendations for Other Services: OT consult Follow Up Recommendations: Outpatient PT (OPPT for vestibular rehab) Equipment Recommended: Cane;None recommended by PT (may or may not benefit from use of straight cane) PT Goals  Acute Rehab PT Goals PT Goal Formulation: With patient Time For Goal Achievement: 7 days Pt will go Sit to Stand: Independently PT Goal: Sit to Stand - Progress: Goal set today Pt will go Stand to Sit: Independently PT Goal: Stand to Sit - Progress: Goal set today Pt will Ambulate: >150 feet;Independently;Other (comment) (with <2/10 symptoms with head turns) PT Goal: Ambulate - Progress: Goal set today Additional Goals PT Goal: Additional Goal #1 - Progress: Goal set today (perform gaze stability x1 viewing HEP indpendently) PT Goal: Additional Goal #2 - Progress: Goal set today (perform rhomberg/sharpened rhomberg 30 sec min assist)  PT Evaluation Precautions/Restrictions  Precautions Precautions: Fall (diplopia and vestibular dysequillbrium with amb-mild) Required Braces or Orthoses: No Restrictions Weight Bearing Restrictions: No Prior Functioning  Home Living Lives With: Spouse Receives Help From: Family Type of Home: House Home Layout: One level Home Access: Stairs to enter Entrance Stairs-Rails: None Entrance Stairs-Number of Steps: 1 Home Adaptive Equipment: None Additional Comments: wife is a Engineer, civil (consulting) Prior Function Level of Independence: Independent with basic ADLs;Independent with gait;Independent with homemaking with  ambulation;Independent with transfers Able to Take  Stairs?: Yes Driving: Yes Cognition Cognition Arousal/Alertness: Awake/alert Overall Cognitive Status: Appears within functional limits for tasks assessed Orientation Level: Oriented X4 Sensation/Coordination Coordination Gross Motor Movements are Fluid and Coordinated: Yes Extremity Assessment RUE Assessment RUE Assessment: Within Functional Limits LUE Assessment LUE Assessment: Within Functional Limits RLE Assessment RLE Assessment: Within Functional Limits LLE Assessment LLE Assessment: Within Functional Limits Mobility (including Balance) Bed Mobility Bed Mobility: Yes Supine to Sit: 7: Independent Sit to Supine: 7: Independent Transfers Transfers: Yes Sit to Stand: 5: Supervision Sit to Stand Details (indicate cue type and reason): standby assist for dysequilibrium, no LOB or instability noted Stand to Sit: 5: Supervision Stand to Sit Details: increased vestibular dysequilibrium after amb, standby assist to minimal guard as turns 180 to align with surface, uses UE's for postural stability. Ambulation/Gait Ambulation/Gait: Yes Ambulation/Gait Assistance: 5: Supervision Ambulation/Gait Assistance Details (indicate cue type and reason): independent with gait mechanics, however safety impaired by visual deficits (diplopia, corrected with single eye patch) and increased instability upon challenge with DGI (see that assessment for details).  No LOB, no physical assist required. Ambulation Distance (Feet): 150 Feet Assistive device: None Gait Pattern: Decreased stride length Gait velocity: WFL Stairs: No Wheelchair Mobility Wheelchair Mobility: No  Posture/Postural Control Posture/Postural Control: Postural limitations Postural Limitations: vestibular system impairments of central etiology resulting in increased instability with dynamic functional tasks Balance Balance Assessed: Yes Dynamic Gait Index Level Surface:  Normal Change in Gait Speed: Normal Gait with Horizontal Head Turns: Moderate Impairment Gait with Vertical Head Turns: Moderate Impairment Gait and Pivot Turn: Mild Impairment Step Over Obstacle: Mild Impairment Step Around Obstacles: Normal Steps: Normal Total Score: 18  Exercise  Other Exercises Other Exercises: X1 viewing for gaze stability retraining: 20 repetitions in sitting repeated 3-5 times/day End of Session PT - End of Session Activity Tolerance: Patient tolerated treatment well Patient left: in bed Nurse Communication: Mobility status for transfers;Mobility status for ambulation General Behavior During Session: [facility] for tasks performed Cognition: [facility] for tasks performed  Dennis Bast 05/02/2011, 10:46 AM

## 2011-05-02 NOTE — Progress Notes (Signed)
Physical Therapy Treatment Patient Details Name: Timothy Warner MRN: [id number] DOB: 1945-08-01 Today's Date: 05/02/2011     PT Treatment Precautions/Restrictions : Review of orders reveals bedrest activity; 2 similar orders written, one to expires after 8 am 05/02/11, another to expire 'until specified'.  MD, please clarify whether patient may be OOB for mobility evaluation.  PT will follow up this date to proceed with eval if medically appropriate.  Thank you.  Narda Amber [facility] 05/02/2011, 8:34 AM

## 2011-05-02 NOTE — Progress Notes (Signed)
Subjective: Patient seen and examined, admitted with diplopia, MRI Brain showed acute infarct in the mid brain.  Objective: Vital signs in last 24 hours: Temp:  [97.5 F (36.4 C)-98.6 F (37 C)] 97.7 F (36.5 C) (03/03 0959) Pulse Rate:  [55-89] 68  (03/03 0959) Resp:  [18-20] 18  (03/03 0959) BP: (129-167)/(73-91) 156/82 mmHg (03/03 0959) SpO2:  [93 %-99 %] 95 % (03/03 0959) Weight:  [81.647 kg (180 lb)] 81.647 kg (180 lb) (03/02 2230) Weight change:  Last BM Date: 04/30/11  Intake/Output from previous day: 03/02 0701 - 03/03 0700 In: -  Out: 250 [Urine:250]     Physical Exam: HEENT: Atraumatic, normocephalic Neck: Supple Chest : Clear to auscultation bilaterally, no wheezing, no crackles Heart : S1 S2 Regular, no murmurs Abdomen: Soft, Nontender, no organomegaly Ext : No cyanosis, clubbing, edema Neuro: Motor strength is 5/5 in all extremities, visual fields not tested, one eye is patched  Lab Results: Basic Metabolic Panel:  Basename 05/02/11 0003 05/01/11 1532  NA -- 140  K -- 4.3  CL -- 103  CO2 -- 29  GLUCOSE -- 211*  BUN -- 11  CREATININE 0.89 0.99  CALCIUM -- 10.0  MG -- --  PHOS -- --   Liver Function Tests: No results found for this basename: AST:2,ALT:2,ALKPHOS:2,BILITOT:2,PROT:2,ALBUMIN:2 in the last 72 hours No results found for this basename: LIPASE:2,AMYLASE:2 in the last 72 hours No results found for this basename: AMMONIA:2 in the last 72 hours CBC:  Basename 05/02/11 0003 05/01/11 1532  WBC 7.7 6.8  NEUTROABS -- --  HGB 13.0 12.3*  HCT 39.0 36.8*  MCV 84.6 84.4  PLT 166 158   Cardiac Enzymes: No results found for this basename: CKTOTAL:3,CKMB:3,CKMBINDEX:3,TROPONINI:3 in the last 72 hours BNP: No results found for this basename: PROBNP:3 in the last 72 hours D-Dimer: No results found for this basename: DDIMER:2 in the last 72 hours CBG:  Basename 05/02/11 0702 05/01/11 2249  GLUCAP 118* 81   Hemoglobin A1C:  Basename  05/02/11 0625  HGBA1C 8.3*   Fasting Lipid Panel:  Basename 05/02/11 0625  CHOL 160  HDL 36*  LDLCALC 89  TRIG 865*  CHOLHDL 4.4  LDLDIRECT --    Studies/Results: Dg Chest 2 View  05/02/2011  *RADIOLOGY REPORT*  Clinical Data: Status post stroke.  Hypertension and coronary artery disease.  CHEST - 2 VIEW  Comparison: 02/08/2011  Findings: Heart size appears normal.  Asymmetric elevation of the right hemidiaphragm is noted.  This is unchanged from previous exam.  There is no airspace consolidation.  No edema or effusions identified.  IMPRESSION:  1.  Chronic asymmetric elevation of the right hemidiaphragm. 2.  No acute findings.  Original Report Authenticated By: Rosealee Albee, M.D.   Ct Head Wo Contrast  05/01/2011  *RADIOLOGY REPORT*  Clinical Data: Blurred vision since this morning.  History of stroke.  CT HEAD WITHOUT CONTRAST  Technique:  Contiguous axial images were obtained from the base of the skull through the vertex without contrast.  Comparison: 02/07/2011  Findings: There is mild central cortical atrophy.  Periventricular white matter changes are consistent with small vessel disease. There is no evidence for hemorrhage, mass lesion, or acute infarction.  Scattered small lacunar infarcts in the basal ganglia are chronic.  Bone windows are unremarkable.  IMPRESSION:  1.  Atrophy and small vessel disease. 2. No evidence for acute intracranial abnormality.  Original Report Authenticated By: Patterson Hammersmith, M.D.   Mr [facility] Wo Contrast  05/01/2011  *  RADIOLOGY REPORT*  Clinical Data:  Diplopia  MRI HEAD WITHOUT CONTRAST MRA HEAD WITHOUT CONTRAST  Technique:  Multiplanar, multiecho pulse sequences of the brain and surrounding structures were obtained without intravenous contrast. Angiographic images of the head were obtained using MRA technique without contrast.  Comparison:  CT 05/01/2011  MRI HEAD  Findings:  Small area of acute infarction in the right mid brain causing the   diplopia.  No other acute infarct.  Generalized atrophy.  Chronic ischemic changes are present in the white matter bilaterally.  Chronic infarct in the left thalamus and basal ganglia bilaterally.  Mild chronic ischemia in the pons. Negative for hemorrhage or mass lesion.  IMPRESSION: Acute infarct in the right mid brain accounting for diplopia.  MRA HEAD  Findings: Distal right vertebral artery is not visualized and may be occluded or hypoplastic.  Left vertebral artery and basilar are patent.  Prominent right AICA is patent.  Left AICA not seen.  Left PICA not visualized.  Superior cerebellar and posterior cerebral arteries are patent bilaterally.  Fetal origin of the left posterior cerebral artery.  Mild disease in the posterior cerebral arteries bilaterally.  Internal carotid artery is patent bilaterally.  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  The anterior and middle cerebral arteries are patent bilaterally.  Diffuse mild atherosclerotic disease is present bilaterally.  No aneurysm.  IMPRESSION: Mild  intracranial atherosclerotic disease. This appears more prominent than on the recent MRI of 02/08/2011 which may be related to motion artifact on the current study.  Right vertebral artery is not visualized and may be occluded.  This is unchanged from the MRA 02/08/2011  Original Report Authenticated By: Camelia Phenes, M.D.   Mr Brain Wo Contrast  05/01/2011  *RADIOLOGY REPORT*  Clinical Data:  Diplopia  MRI HEAD WITHOUT CONTRAST MRA HEAD WITHOUT CONTRAST  Technique:  Multiplanar, multiecho pulse sequences of the brain and surrounding structures were obtained without intravenous contrast. Angiographic images of the head were obtained using MRA technique without contrast.  Comparison:  CT 05/01/2011  MRI HEAD  Findings:  Small area of acute infarction in the right mid brain causing the  diplopia.  No other acute infarct.   Generalized atrophy.  Chronic ischemic changes are present in the white matter bilaterally.  Chronic infarct in the left thalamus and basal ganglia bilaterally.  Mild chronic ischemia in the pons. Negative for hemorrhage or mass lesion.  IMPRESSION: Acute infarct in the right mid brain accounting for diplopia.  MRA HEAD  Findings: Distal right vertebral artery is not visualized and may be occluded or hypoplastic.  Left vertebral artery and basilar are patent.  Prominent right AICA is patent.  Left AICA not seen.  Left PICA not visualized.  Superior cerebellar and posterior cerebral arteries are patent bilaterally.  Fetal origin of the left posterior cerebral artery.  Mild disease in the posterior cerebral arteries bilaterally.  Internal carotid artery is patent bilaterally.  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  The anterior and middle cerebral arteries are patent bilaterally.  Diffuse mild atherosclerotic disease is present bilaterally.  No aneurysm.  IMPRESSION: Mild  intracranial atherosclerotic disease. This appears more prominent than on the recent MRI of 02/08/2011 which may be related to motion artifact on the current study.  Right vertebral artery is not visualized and may be occluded.  This is unchanged  from the MRA 02/08/2011  Original Report Authenticated By: Camelia Phenes, M.D.    Medications: Scheduled Meds:   .  stroke: mapping our early stages of recovery book  1 each Does not apply Once  . sodium chloride   Intravenous STAT  . atorvastatin  40 mg Oral QHS  . clopidogrel  75 mg Oral Daily  . enoxaparin  40 mg Subcutaneous Q24H  . insulin aspart  0-5 Units Subcutaneous QHS  . insulin aspart  0-9 Units Subcutaneous TID WC  . insulin glargine  22 Units Subcutaneous QHS  . lisinopril  20 mg Oral Daily  . omega-3 acid ethyl esters  1 g Oral Q breakfast  . pantoprazole  40 mg Oral Q1200  . PARoxetine  40 mg  Oral q morning - 10a  . thiamine  100 mg Oral Daily  . Vitamin D (Ergocalciferol)  50,000 Units Oral Q Fri   Continuous Infusions:  PRN Meds:.acetaminophen, HYDROmorphone (DILAUDID) injection, ondansetron (ZOFRAN) IV, ondansetron (ZOFRAN) IV, oxyCODONE, senna-docusate  Assessment/Plan:   Acute Right midbrain infarct Continue plavix. Neurology following  Diabetes mellitus Blood glucose is well controlled Continue sliding scale Insulin  Hypertension Blood Pressure is stable Continue lisinopril  Hyperlipidemia: Continue crestor  DVT Prophylaxis Lovenox   LOS: 1 day   Darrelyn Morro S [facility] Pager: [phone] 05/02/2011, 11:02 AM

## 2011-05-03 NOTE — Progress Notes (Signed)
Utilization Review Completed.Miela Desjardin T3/05/2011   

## 2012-01-08 DIAGNOSIS — Z8249 Family history of ischemic heart disease and other diseases of the circulatory system: Secondary | ICD-10-CM | POA: Diagnosis not present

## 2012-01-08 DIAGNOSIS — Z8673 Personal history of transient ischemic attack (TIA), and cerebral infarction without residual deficits: Secondary | ICD-10-CM | POA: Diagnosis not present

## 2012-01-08 DIAGNOSIS — Z833 Family history of diabetes mellitus: Secondary | ICD-10-CM | POA: Diagnosis not present

## 2012-01-08 DIAGNOSIS — S40019A Contusion of unspecified shoulder, initial encounter: Secondary | ICD-10-CM | POA: Diagnosis not present

## 2012-01-08 DIAGNOSIS — Z888 Allergy status to other drugs, medicaments and biological substances status: Secondary | ICD-10-CM | POA: Diagnosis not present

## 2012-01-08 DIAGNOSIS — M19019 Primary osteoarthritis, unspecified shoulder: Secondary | ICD-10-CM | POA: Diagnosis not present

## 2012-01-08 DIAGNOSIS — I10 Essential (primary) hypertension: Secondary | ICD-10-CM | POA: Diagnosis not present

## 2012-01-08 DIAGNOSIS — E119 Type 2 diabetes mellitus without complications: Secondary | ICD-10-CM | POA: Diagnosis not present

## 2012-10-27 IMAGING — CT CT HEAD W/O CM
2 series · 16 of 30 positions shown, 20 images · non-contrast
Comparison: 02/07/2011

CLINICAL DATA: Blurred vision since this morning.  History of
stroke.

CT HEAD WITHOUT CONTRAST
TECHNIQUE: Contiguous axial images were obtained from the base of
the skull through the vertex without contrast.

[Series 2: head w/o · axial · non-contrast · 0.48mm/px · z∈[+69,+194]mm · 13 of 31 slices shown, 17 images]
[im 3/31  brain]
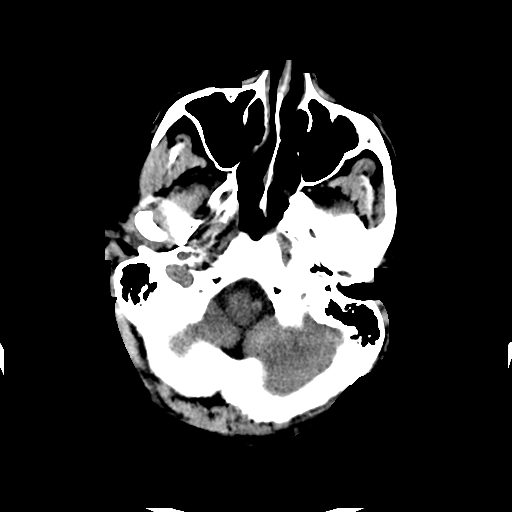
[im 3/31  bone]
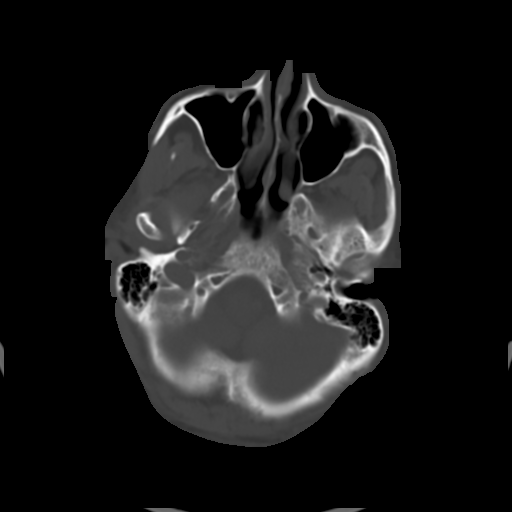
[im 5/31  brain]
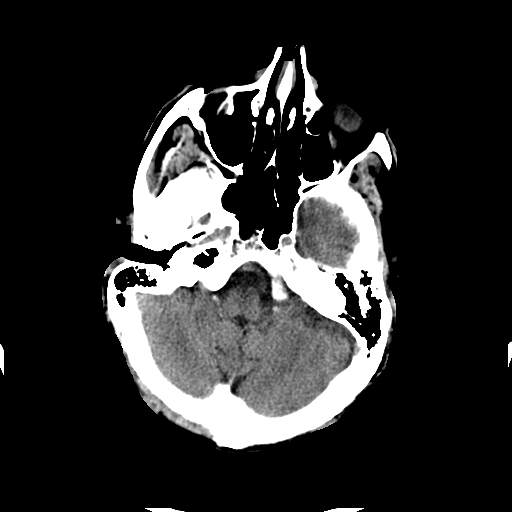
[im 7/31  brain]
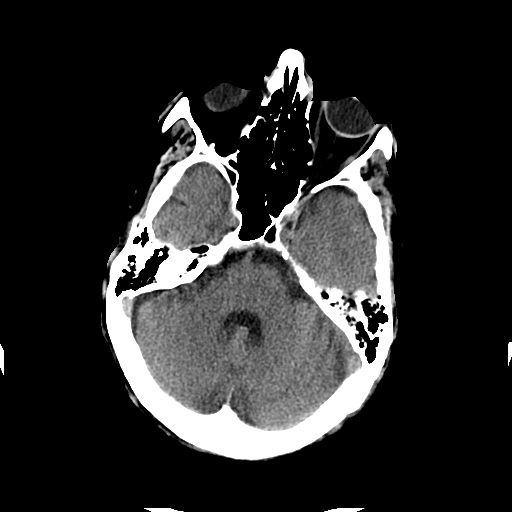
[im 9/31  brain]
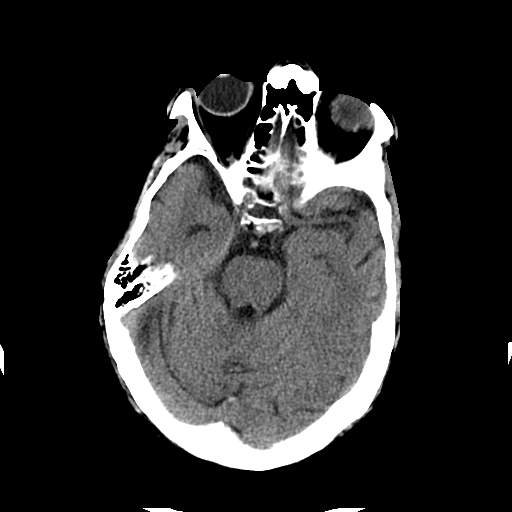
[im 11/31  brain]
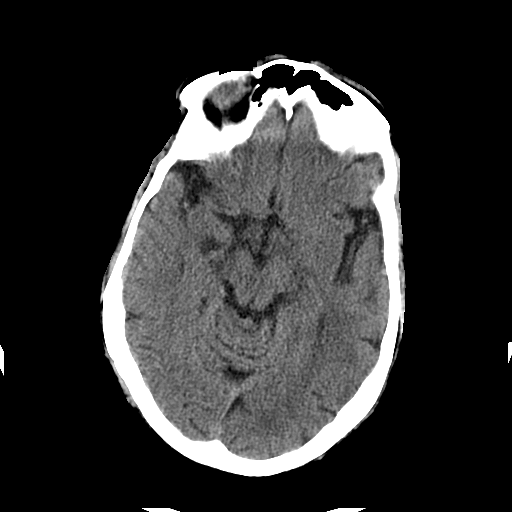
[im 11/31  bone]
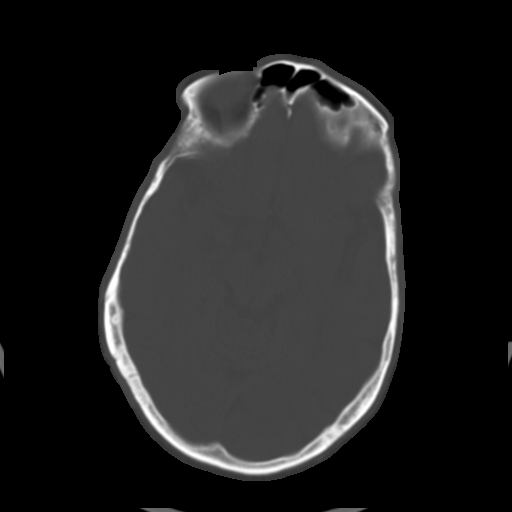
[im 13/31  brain]
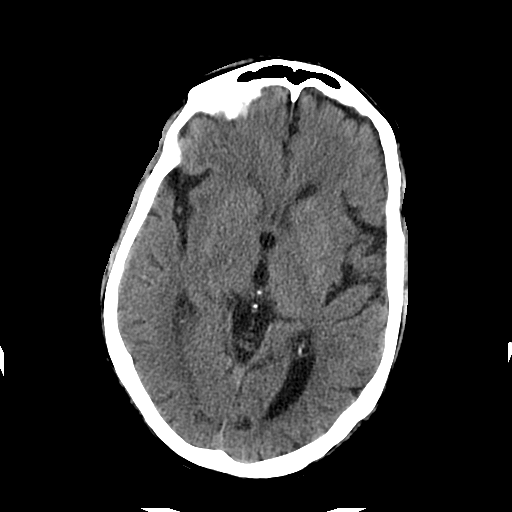
[im 16/31  brain]
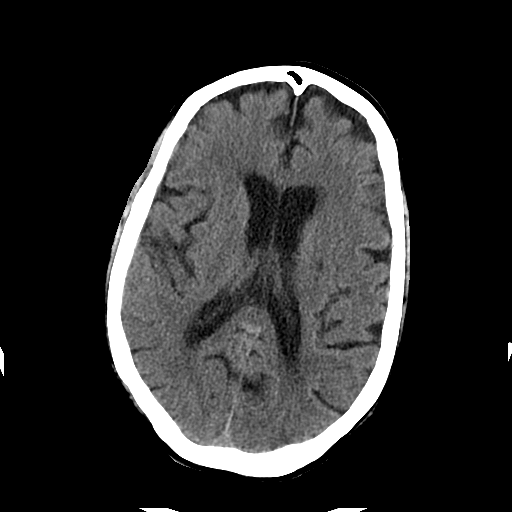
[im 18/31  brain]
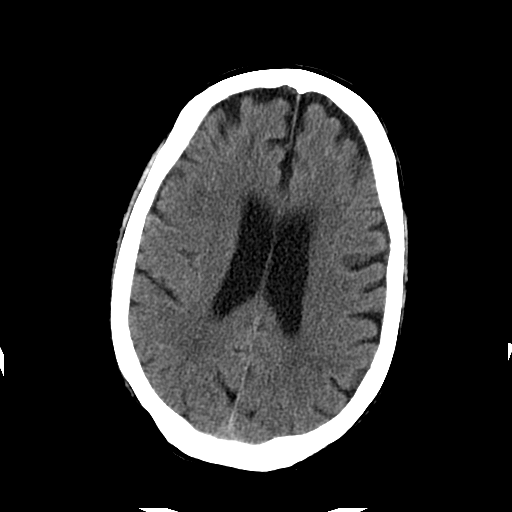
[im 20/31  brain]
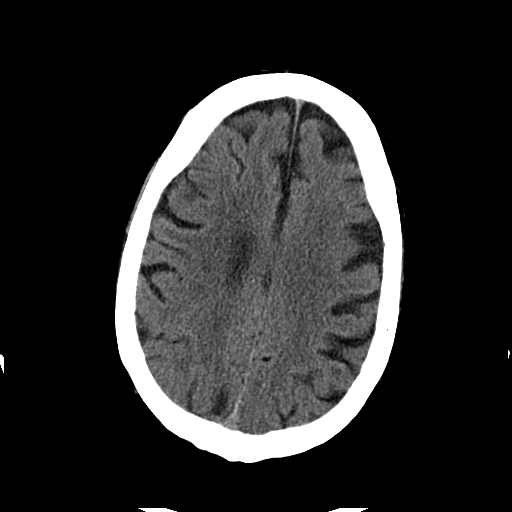
[im 20/31  bone]
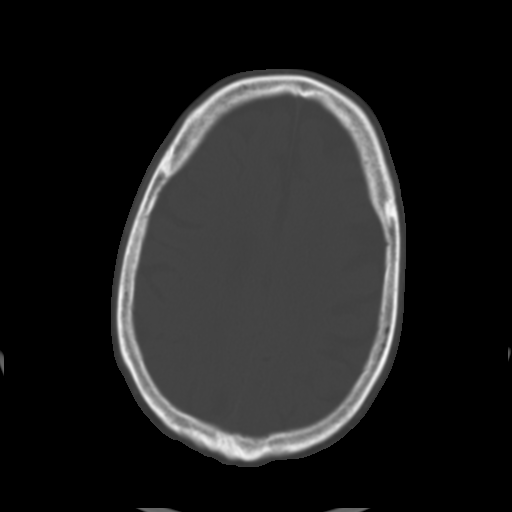
[im 22/31  brain]
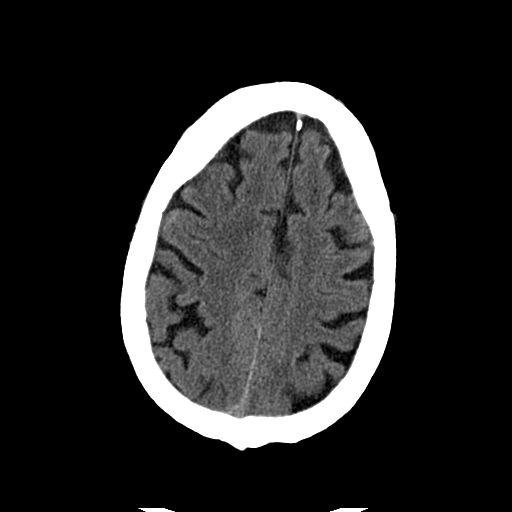
[im 24/31  brain]
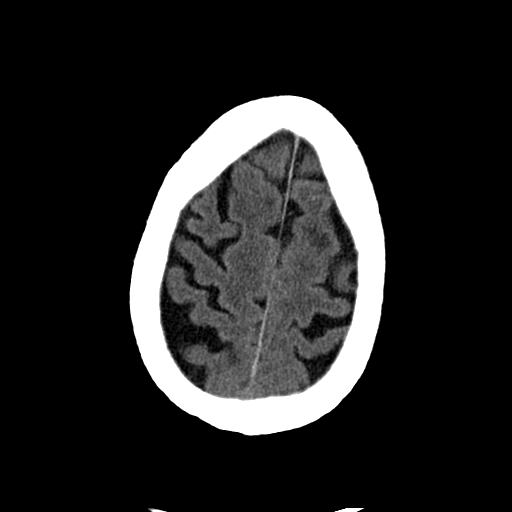
[im 26/31  brain]
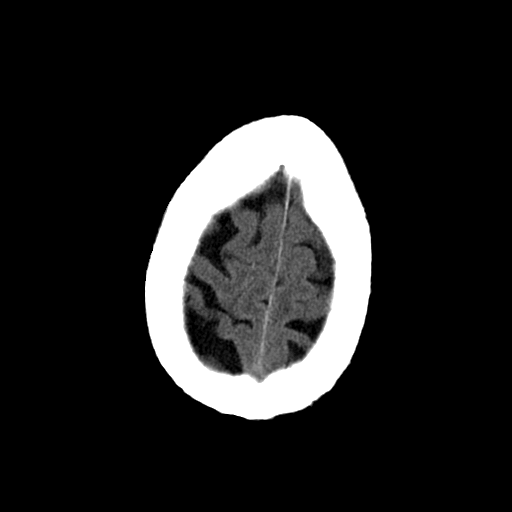
[im 28/31  brain]
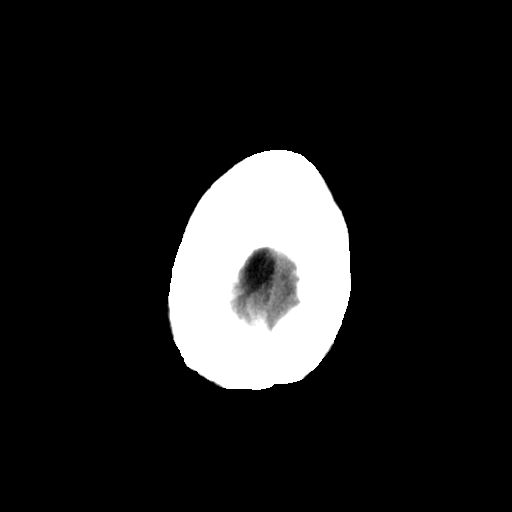
[im 28/31  bone]
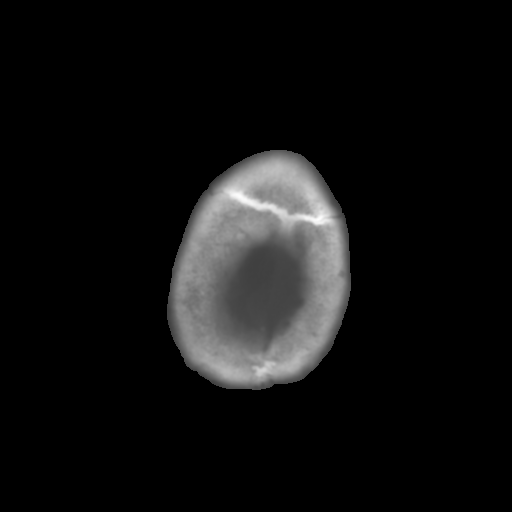

[Series 3: head w/o bone · axial · non-contrast · 0.48mm/px · z∈[+69,+109]mm · 3 of 31 slices shown]
[im 3/31  bone]
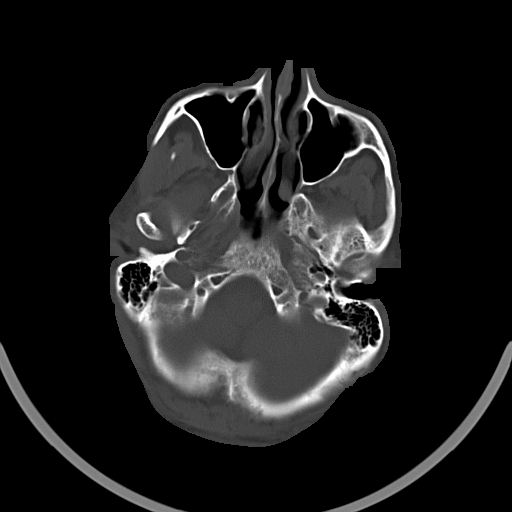
[im 7/31  bone]
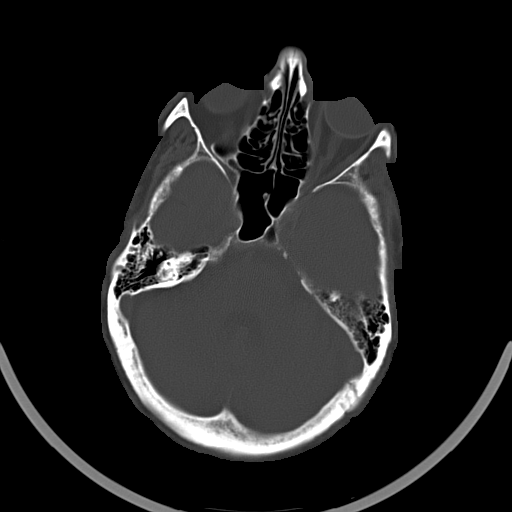
[im 11/31  bone]
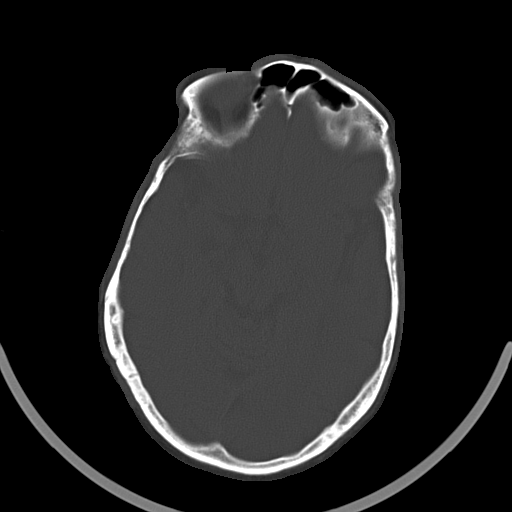

[16 of 30 positions shown; findings below may reference images not displayed]

FINDINGS: There is mild central cortical atrophy.  Periventricular
white matter changes are consistent with small vessel disease.
There is no evidence for hemorrhage, mass lesion, or acute
infarction.  Scattered small lacunar infarcts in the basal ganglia
are chronic.  Bone windows are unremarkable.
IMPRESSION: 1.  Atrophy and small vessel disease.
2. No evidence for acute intracranial abnormality.

## 2013-05-28 ENCOUNTER — Emergency Department (HOSPITAL_COMMUNITY)
Admission: EM | Admit: 2013-05-28 | Discharge: 2013-05-29 | Disposition: A | Discharge status: Home / Self Care | Payer: Non-veteran care | Attending: Emergency Medicine | Admitting: Emergency Medicine

## 2013-05-28 ENCOUNTER — Emergency Department (HOSPITAL_COMMUNITY): Payer: Non-veteran care

## 2013-05-28 ENCOUNTER — Encounter (HOSPITAL_COMMUNITY): Payer: Self-pay | Admitting: Emergency Medicine

## 2013-05-28 DIAGNOSIS — S0590XA Unspecified injury of unspecified eye and orbit, initial encounter: Secondary | ICD-10-CM | POA: Insufficient documentation

## 2013-05-28 DIAGNOSIS — E119 Type 2 diabetes mellitus without complications: Secondary | ICD-10-CM | POA: Insufficient documentation

## 2013-05-28 DIAGNOSIS — Z9861 Coronary angioplasty status: Secondary | ICD-10-CM | POA: Insufficient documentation

## 2013-05-28 DIAGNOSIS — I2510 Atherosclerotic heart disease of native coronary artery without angina pectoris: Secondary | ICD-10-CM | POA: Insufficient documentation

## 2013-05-28 DIAGNOSIS — S8000XA Contusion of unspecified knee, initial encounter: Secondary | ICD-10-CM

## 2013-05-28 DIAGNOSIS — Z794 Long term (current) use of insulin: Secondary | ICD-10-CM | POA: Insufficient documentation

## 2013-05-28 DIAGNOSIS — Y9301 Activity, walking, marching and hiking: Secondary | ICD-10-CM | POA: Insufficient documentation

## 2013-05-28 DIAGNOSIS — Z8673 Personal history of transient ischemic attack (TIA), and cerebral infarction without residual deficits: Secondary | ICD-10-CM | POA: Insufficient documentation

## 2013-05-28 DIAGNOSIS — R296 Repeated falls: Secondary | ICD-10-CM | POA: Insufficient documentation

## 2013-05-28 DIAGNOSIS — R55 Syncope and collapse: Secondary | ICD-10-CM | POA: Insufficient documentation

## 2013-05-28 DIAGNOSIS — Y9229 Other specified public building as the place of occurrence of the external cause: Secondary | ICD-10-CM | POA: Insufficient documentation

## 2013-05-28 DIAGNOSIS — Z87891 Personal history of nicotine dependence: Secondary | ICD-10-CM | POA: Insufficient documentation

## 2013-05-28 DIAGNOSIS — E785 Hyperlipidemia, unspecified: Secondary | ICD-10-CM | POA: Insufficient documentation

## 2013-05-28 DIAGNOSIS — I10 Essential (primary) hypertension: Secondary | ICD-10-CM | POA: Insufficient documentation

## 2013-05-28 DIAGNOSIS — Z79899 Other long term (current) drug therapy: Secondary | ICD-10-CM | POA: Insufficient documentation

## 2013-05-28 LAB — CBG MONITORING, ED: GLUCOSE-CAPILLARY: 108 mg/dL — AB (ref 70–99)

## 2013-05-28 NOTE — ED Notes (Signed)
Dr. Horton at bedside. 

## 2013-05-28 NOTE — ED Notes (Signed)
Pt had syncopal episode today after washing dishes. Pt states he has had 5 syncopal episodes in the last 47yrs. Pt has been dx with vertigo but has not been treated for it. This is the second fall in 2 weeks. Pt fell last week and scraped lt knee, and has bruising to rt side of abd. Pt states he hit his head. Per wife pt was not breathing when he passed out. Upon EMS arrival pt was alert and oriented. Initial pressure was 200/100, last pressure was 155/80. Pt has hx stroke, HTN, and diabetes. Pt alert and oriented x4. Denies any pain at this time.

## 2013-05-28 NOTE — ED Provider Notes (Signed)
CSN: [id number]     Arrival date & time 05/28/13  2247 History   First MD Initiated Contact with Patient 05/28/13 2252     Chief Complaint  Patient presents with  . Loss of Consciousness     (Consider location/radiation/quality/duration/timing/severity/associated sxs/prior Treatment) HPI  This is a 68 yo male with history of diabetes, hypertension, hyperlipidemia, coronary artery disease, cardiomyopathy and stroke who presents with loss of consciousness. Patient reports multiple episodes of syncope over the last 2 years. He states that he had 2 strokes 2 years ago and since that time has had problems with syncope. He reports short onset of presyncopal symptoms and feeling dizzy or feeling off balance and the next thing he knows "I'm on the floor." Patient states that tonight he was washing dishes and was walking down the hall when he passed out. He states that he thinks he hit his head. He denies any headache, chest pain, or shortness of breath. Patient states that [facility] is [facility] He has been diagnosed with vertigo which is thought to be the cause of his symptoms. Patient denies any dizziness or room spinning. Patient reports that prior to tonight he had a fall last week where he hit his head and scraped his knees. Patient was brought in by EMS. He was awake and alert on arrival per report. Initial vital signs notable for blood pressure of 200/100. Vital signs here with BP 150/92.  Past Medical History  Diagnosis Date  . DM (diabetes mellitus)     x8 years  . HTN (hypertension)   . HLD (hyperlipidemia)   . CAD (coronary artery disease)     cypher stenting to the circ and occluded RCA 2003.   . Cardiomyopathy     EF 45%  . Stroke    Past Surgical History  Procedure Laterality Date  . Coronary angioplasty with stent placement     Family History  Problem Relation Age of Onset  . Stroke      family hx  . Diabetes      family hx  . Hypertension      family hx     History  Substance Use Topics  . Smoking status: Former Games developermoker  . Smokeless tobacco: Not on file  . Alcohol Use: No    Review of Systems  Constitutional: Negative.  Negative for fever and chills.  Eyes: Negative for photophobia.  Respiratory: Negative.  Negative for chest tightness and shortness of breath.   Cardiovascular: Negative.  Negative for chest pain.  Gastrointestinal: Negative.  Negative for nausea, vomiting and abdominal pain.  Genitourinary: Negative.  Negative for dysuria.  Musculoskeletal: Negative for back pain.  Skin: Negative for rash.  Neurological: Positive for syncope. Negative for dizziness, seizures, weakness and headaches.  All other systems reviewed and are negative.      Allergies  Ambien  Home Medications   Current Outpatient Rx  Name  Route  Sig  Dispense  Refill  . atorvastatin (LIPITOR) 80 MG tablet   Oral   Take 40 mg by mouth at bedtime. For cholesterol         . dipyridamole-aspirin (AGGRENOX) 200-25 MG per 12 hr capsule   Oral   Take 1 capsule by mouth 2 (two) times daily.         . insulin glargine (LANTUS) 100 UNIT/ML injection   Subcutaneous   Inject 32 Units into the skin at bedtime.          .Marland Kitchen  losartan (COZAAR) 100 MG tablet   Oral   Take 50 mg by mouth daily.         . metFORMIN (GLUCOPHAGE) 1000 MG tablet   Oral   Take 1,000 mg by mouth 2 (two) times daily with a meal.         . PARoxetine (PAXIL) 40 MG tablet   Oral   Take 40 mg by mouth every morning.          . thiamine (VITAMIN B-1) 100 MG tablet   Oral   Take 100 mg by mouth daily.           BP 116/69  Pulse 115  Temp(Src) 97.9 F (36.6 C) (Oral)  Resp 18  SpO2 100% Physical Exam  Nursing note and vitals reviewed. Constitutional: He is oriented to person, place, and time. No distress.  Elderly  HENT:  Head: Normocephalic.  Right Ear: External ear normal.  Left Ear: External ear normal.  Mouth/Throat: Oropharynx is clear and moist.   Abrasion just superior and lateral to the left eye approximately 4 cm, abrasion and burn over the left face, no hematoma noted  Eyes: EOM are normal. Pupils are equal, round, and reactive to light.  Neck: Neck supple. No JVD present.  Cardiovascular: Normal rate, regular rhythm and normal heart sounds.   No murmur heard. Pulmonary/Chest: Effort normal and breath sounds normal. No respiratory distress. He has no wheezes.  Abdominal: Soft. Bowel sounds are normal. There is no tenderness. There is no rebound.  Musculoskeletal: He exhibits no edema.  Healing abrasions to the bilateral knees  Lymphadenopathy:    He has no cervical adenopathy.  Neurological: He is alert and oriented to person, place, and time. No cranial nerve deficit.  Coordination intact to finger-nose-finger, no clonus noted, 5 out of 5 strength in all 4 extremities  Skin: Skin is warm and dry.  Psychiatric: He has a normal mood and affect.    ED Course  Procedures (including critical care time) Labs Review Labs Reviewed  CBC WITH DIFFERENTIAL - Abnormal; Notable for the following:    WBC 12.6 (*)    RBC 4.08 (*)    HCT 37.7 (*)    Neutro Abs 7.9 (*)    All other components within normal limits  BASIC METABOLIC PANEL - Abnormal; Notable for the following:    Glucose, Bld 102 (*)    GFR calc non Af Amer 85 (*)    All other components within normal limits  CBG MONITORING, ED - Abnormal; Notable for the following:    Glucose-Capillary 108 (*)    All other components within normal limits  TROPONIN I  URINALYSIS, ROUTINE W REFLEX MICROSCOPIC   Imaging Review Ct Head Wo Contrast  05/29/2013   CLINICAL DATA:  Loss of consciousness  EXAM: CT HEAD WITHOUT CONTRAST  TECHNIQUE: Contiguous axial images were obtained from the base of the skull through the vertex without intravenous contrast.  COMPARISON:  05/01/2011  FINDINGS: Skull and Sinuses:No significant abnormality.  Orbits: No acute abnormality.  Brain: No evidence of  acute abnormality, such as acute infarction, hemorrhage, hydrocephalus, or mass lesion/mass effect. Generalized brain atrophy. Chronic small vessel disease with stable pattern of white matter injury (when accounting for differences in imaging angle). Notable remote white matter perforator infarct affecting the left corona radiata. Patchy subcortical low-density in the bilateral frontal lobes, especially on the left.  IMPRESSION: 1. No evidence of acute intracranial disease. 2. Age advanced brain atrophy and chronic small  vessel disease.   Electronically Signed   By: Tiburcio Pea M.D.   On: 05/29/2013 00:08     EKG Interpretation   Date/Time:  Monday May 28 2013 22:57:07 EDT Ventricular Rate:  89 PR Interval:  195 QRS Duration: 115 QT Interval:  396 QTC Calculation: 482 R Axis:   -19 Text Interpretation:  Sinus rhythm Nonspecific intraventricular conduction  delay No significant change since last tracing Confirmed by Matt Delpizzo  MD,  Toni Amend ([id number]) on 05/28/2013 11:00:23 PM      MDM   Final diagnoses:  Syncope  Contusion, knee   Patient presents with syncope. He is nontoxic on exam and nonfocal on neurologic exam. Initial vital signs are reassuring. Patient reports recurrent syncope over the last 2 years. He is followed by his primary care physician but has not been evaluated by cardiology or neurology. He does have presyncopal symptoms and there is no report of seizure activity. Patient has no evidence of orthostasis. CT head was obtained given abrasions to the face and possible head trauma. CT head is negative. EKG is reassuring without evidence of arrhythmia.  No murmur suggestive of valvular disease. Patient was placed on cardiac monitor and had no evidence of arrhythmia during monitoring. Workup is largely reassuring including a troponin. Discuss with patient and his family reassuring workup. Have offered the patient admission for observation given multiple episodes over the last 2  weeks. Patient would like to be discharged to followup with primary care physician. I have encouraged the patient to followup with cardiology (seen previously by Dr. Antoine Poche) and to be referred to neurology. Patient stated understanding. He was given strict return precautions.  After history, exam, and medical workup I feel the patient has been appropriately medically screened and is safe for discharge home. Pertinent diagnoses were discussed with the patient. Patient was given return precautions.     Shon Baton, MD 05/29/13 [phone]

## 2013-05-29 LAB — CBC WITH DIFFERENTIAL/PLATELET
BASOS PCT: 0 % (ref 0–1)
Basophils Absolute: 0 10*3/uL (ref 0.0–0.1)
EOS ABS: 0.1 10*3/uL (ref 0.0–0.7)
Eosinophils Relative: 1 % (ref 0–5)
HCT: 37.7 % — ABNORMAL LOW (ref 39.0–52.0)
Hemoglobin: 13.4 g/dL (ref 13.0–17.0)
Lymphocytes Relative: 29 % (ref 12–46)
Lymphs Abs: 3.6 10*3/uL (ref 0.7–4.0)
MCH: 32.8 pg (ref 26.0–34.0)
MCHC: 35.5 g/dL (ref 30.0–36.0)
MCV: 92.4 fL (ref 78.0–100.0)
Monocytes Absolute: 0.9 10*3/uL (ref 0.1–1.0)
Monocytes Relative: 7 % (ref 3–12)
NEUTROS PCT: 63 % (ref 43–77)
Neutro Abs: 7.9 10*3/uL — ABNORMAL HIGH (ref 1.7–7.7)
PLATELETS: 194 10*3/uL (ref 150–400)
RBC: 4.08 MIL/uL — ABNORMAL LOW (ref 4.22–5.81)
RDW: 13.4 % (ref 11.5–15.5)
WBC: 12.6 10*3/uL — ABNORMAL HIGH (ref 4.0–10.5)

## 2013-05-29 LAB — BASIC METABOLIC PANEL
BUN: 8 mg/dL (ref 6–23)
CALCIUM: 8.8 mg/dL (ref 8.4–10.5)
CO2: 21 mEq/L (ref 19–32)
Chloride: 100 mEq/L (ref 96–112)
Creatinine, Ser: 0.95 mg/dL (ref 0.50–1.35)
GFR, EST NON AFRICAN AMERICAN: 85 mL/min — AB (ref >90)
Glucose, Bld: 102 mg/dL — ABNORMAL HIGH (ref 70–99)
Potassium: 4.2 mEq/L (ref 3.7–5.3)
SODIUM: 139 meq/L (ref 137–147)

## 2013-05-29 LAB — URINALYSIS, ROUTINE W REFLEX MICROSCOPIC
BILIRUBIN URINE: NEGATIVE
GLUCOSE, UA: NEGATIVE mg/dL
Hgb urine dipstick: NEGATIVE
KETONES UR: NEGATIVE mg/dL
LEUKOCYTES UA: NEGATIVE
Nitrite: NEGATIVE
PROTEIN: NEGATIVE mg/dL
Specific Gravity, Urine: 1.01 (ref 1.005–1.030)
Urobilinogen, UA: 0.2 mg/dL (ref 0.0–1.0)
pH: 5 (ref 5.0–8.0)

## 2013-05-29 LAB — TROPONIN I: Troponin I: <0.3 ng/mL (ref <0.30)

## 2013-05-29 NOTE — Discharge Instructions (Signed)
Syncope Syncope is a fainting spell. This means the person loses consciousness and drops to the ground. The person is generally unconscious for less than 5 minutes. The person may have some muscle twitches for up to 15 seconds before waking up and returning to normal. Syncope occurs more often in elderly people, but it can happen to anyone. While most causes of syncope are not dangerous, syncope can be a sign of a serious medical problem. It is important to seek medical care.   YOU SHOULD NOT DRIVE.  CAUSES  Syncope is caused by a sudden decrease in blood flow to the brain. The specific cause is often not determined. Factors that can trigger syncope include:  Taking medicines that lower blood pressure.  Sudden changes in posture, such as standing up suddenly.  Taking more medicine than prescribed.  Standing in one place for too long.  Seizure disorders.  Dehydration and excessive exposure to heat.  Low blood sugar (hypoglycemia).  Straining to have a bowel movement.  Heart disease, irregular heartbeat, or other circulatory problems.  Fear, emotional distress, seeing blood, or severe pain. SYMPTOMS  Right before fainting, you may:  Feel dizzy or lightheaded.  Feel nauseous.  See all white or all black in your field of vision.  Have cold, clammy skin. DIAGNOSIS  Your caregiver will ask about your symptoms, perform a physical exam, and perform electrocardiography (ECG) to record the electrical activity of your heart. Your caregiver may also perform other heart or blood tests to determine the cause of your syncope. TREATMENT  In most cases, no treatment is needed. Depending on the cause of your syncope, your caregiver may recommend changing or stopping some of your medicines. HOME CARE INSTRUCTIONS  Have someone stay with you until you feel stable.  Do not drive, operate machinery, or play sports until your caregiver says it is okay.  Keep all follow-up appointments as  directed by your caregiver.  Lie down right away if you start feeling like you might faint. Breathe deeply and steadily. Wait until all the symptoms have passed.  Drink enough fluids to keep your urine clear or pale yellow.  If you are taking blood pressure or heart medicine, get up slowly, taking several minutes to sit and then stand. This can reduce dizziness. SEEK IMMEDIATE MEDICAL CARE IF:   You have a severe headache.  You have unusual pain in the chest, abdomen, or back.  You are bleeding from the mouth or rectum, or you have black or tarry stool.  You have an irregular or very fast heartbeat.  You have pain with breathing.  You have repeated fainting or seizure-like jerking during an episode.  You faint when sitting or lying down.  You have confusion.  You have difficulty walking.  You have severe weakness.  You have vision problems. If you fainted, call your local emergency services (911 in [place] Do not drive yourself to the hospital.  MAKE SURE YOU:  Understand these instructions.  Will watch your condition.  Will get help right away if you are not doing well or get worse. Document Released: 02/15/2005 Document Revised: 08/17/2011 Document Reviewed: 04/16/2011 [facility] Patient Information 2014 [place], [place].

## 2013-05-29 NOTE — ED Notes (Signed)
Discharge instructions reviewed. Pt verbalized understanding.  

## 2013-06-11 ENCOUNTER — Encounter: Payer: Self-pay | Admitting: Cardiology

## 2013-06-11 ENCOUNTER — Telehealth: Payer: Self-pay | Admitting: *Deleted

## 2013-06-19 ENCOUNTER — Ambulatory Visit: Payer: Non-veteran care | Admitting: Cardiology

## 2013-07-02 NOTE — Telephone Encounter (Signed)
Encounter Closed---07/02/13 TP 

## 2013-07-18 ENCOUNTER — Encounter: Payer: Self-pay | Admitting: Cardiology

## 2013-07-18 ENCOUNTER — Ambulatory Visit (INDEPENDENT_AMBULATORY_CARE_PROVIDER_SITE_OTHER): Payer: Medicare Other | Admitting: Cardiology

## 2013-07-18 VITALS — BP 132/83 | HR 81 | Ht 69.0 in | Wt 194.4 lb

## 2013-07-18 DIAGNOSIS — I10 Essential (primary) hypertension: Secondary | ICD-10-CM | POA: Diagnosis not present

## 2013-07-18 DIAGNOSIS — I2510 Atherosclerotic heart disease of native coronary artery without angina pectoris: Secondary | ICD-10-CM

## 2013-07-18 DIAGNOSIS — R55 Syncope and collapse: Secondary | ICD-10-CM | POA: Diagnosis not present

## 2013-07-18 NOTE — Patient Instructions (Addendum)
The current medical regimen is effective;  continue present plan and medications.  Your physician has requested that you have a lexiscan myoview. For further information please visit [email or web address] Please follow instruction sheet, as given.  Your physician has recommended that you wear an event monitor. Event monitors are medical devices that record the heart's electrical activity. Doctors most often [place] these monitors to diagnose arrhythmias. Arrhythmias are problems with the speed or rhythm of the heartbeat. The monitor is a small, portable device. You can wear one while you do your normal daily activities. This is usually used to diagnose what is causing palpitations/syncope (passing out).  Follow up will be based on these results.

## 2013-07-18 NOTE — Progress Notes (Signed)
HPI The patient presents for followup of coronary disease and a mildly reduced ejection fraction. It has been greater than 3 years since I last saw him.  I did review some hospital records. He has had a couple of strokes apparently attributed to small vessel disease since I saw him. He said some speech and maybe visual disturbance from this.  I did review an echocardiogram in which his ejection fraction was actually 55% which was improved from previous. Carotid Dopplers and 2012 demonstrated no plaque. He denies any symptoms such as chest pressure, neck or arm discomfort. He denies any new shortness of breath, PND or orthopnea. He is somewhat limited in his activities. The most concerning thing is that he has had syncopal episode. He reports about 5 of these. This typically happens when he goes from a seated to a standing position.  He says he loses consciousness for a few seconds. He first feels a little lightheaded and then we will go out. He doesn't necessarily feel his heart racing or skipping. He does not describe orthostatic symptoms per se.  I did review a head CT which demonstrated advanced small vessel disease. This was done in the emergency room and I saw these records.  Allergies  Allergen Reactions  . Ambien [Zolpidem Tartrate] Other (See Comments)    Hallucinations and Altered Mental Status    Current Outpatient Prescriptions  Medication Sig Dispense Refill  . atorvastatin (LIPITOR) 80 MG tablet Take 40 mg by mouth at bedtime. For cholesterol      . cyanocobalamin (,VITAMIN B-12,) 1000 MCG/ML injection Inject 1,000 mcg into the muscle every 30 (thirty) days.      Marland Kitchen dipyridamole-aspirin (AGGRENOX) 200-25 MG per 12 hr capsule Take 1 capsule by mouth 2 (two) times daily.      . insulin glargine (LANTUS) 100 UNIT/ML injection Inject 32 Units into the skin at bedtime.       Marland Kitchen losartan (COZAAR) 100 MG tablet Take 50 mg by mouth daily.      . metFORMIN (GLUCOPHAGE) 1000 MG tablet Take 1,000  mg by mouth 2 (two) times daily with a meal.      . PARoxetine (PAXIL) 40 MG tablet Take 40 mg by mouth every morning.       . thiamine (VITAMIN B-1) 100 MG tablet Take 100 mg by mouth daily.        No current facility-administered medications for this visit.    Past Medical History  Diagnosis Date  . DM (diabetes mellitus)     x8 years  . HTN (hypertension)   . HLD (hyperlipidemia)   . CAD (coronary artery disease)     cypher stenting to the circ and occluded RCA 2003.   . Cardiomyopathy     EF 45%  . Stroke     Past Surgical History  Procedure Laterality Date  . Coronary angioplasty with stent placement      Family History  Problem Relation Age of Onset  . Stroke      family hx  . Diabetes      family hx  . Hypertension      family hx     History   Social History  . Marital Status: Married    Spouse Name: N/A    Number of Children: N/A  . Years of Education: N/A   Occupational History  . Not on file.   Social History Main Topics  . Smoking status: Former Games developer  . Smokeless tobacco: Not on  file  . Alcohol Use: No  . Drug Use: No  . Sexual Activity: Not on file   Other Topics Concern  . Not on file   Social History Narrative   Married.     ROS:  Positive for dizziness, vertigo, diarrhea. Otherwise as stated in the HPI and negative for all other systems.  PHYSICAL EXAM BP 138/82  Pulse 80  Ht 5\' 9"  (1.753 m)  Wt 194 lb 6.4 oz (88.179 kg)  BMI 28.69 kg/m2 GENERAL:  Well appearing HEENT:  Pupils equal round and reactive, fundi not visualized, oral mucosa unremarkable NECK:  No jugular venous distention, waveform within normal limits, carotid upstroke brisk and symmetric, no bruits, no thyromegaly LYMPHATICS:  No cervical, inguinal adenopathy LUNGS:  Clear to auscultation bilaterally BACK:  No CVA tenderness CHEST:  Unremarkable HEART:  PMI not displaced or sustained,S1 and S2 within normal limits, no S3, no S4, no clicks, no rubs, no  murmurs ABD:  Flat, positive bowel sounds normal in frequency in pitch, no bruits, no rebound, no guarding, no midline pulsatile mass, no hepatomegaly, no splenomegaly EXT:  2 plus pulses throughout, no edema, no cyanosis no clubbing SKIN:  No rashes no nodules NEURO:  Cranial nerves II through XII grossly intact, motor grossly intact throughout PSYCH:  Cognitively intact, oriented to person place and time   EKG:  Sinus rhythm, rate 89, low-voltage in the chest leads, poor anterior R wave progression, QTC prolongation, premature atrial contractions, no acute ST-T wave changes.  07/18/2013  ASSESSMENT AND PLAN  SYNCOPE:  I suspect that this is related to orthostasis. However, I would like to get an event monitor. Delma Officerhomas L Grape will need a 21 day event monitor.  The patients symptoms necessitate an event monitor.  The symptoms are too infrequent to be identified on a Holter monitor.    CAD:  The patient will need cardiovascular stress testing as it has been so long since his last testing. However, he would not be a walk on a treadmill. Therefore, he will get a [employer] Myoview.  DM:   He reports his last A1c was 7.5. This is [facility] at [facility]  HTN:  This will be managed as above.    HYPERLIPIDEMIA:  He is on target statin. He needs better control of his diabetes for his triglyceride management and again this is followed at the [facility]  CARDIOMYOPATHY:  His ejection fraction was improved and 2012 and I don't suspect that this would be any worse. No further echocardiograms are suggested otherwise we will see what his ejection fraction is at the time of his stress test.

## 2013-07-20 ENCOUNTER — Telehealth (HOSPITAL_COMMUNITY): Payer: Self-pay | Admitting: *Deleted

## 2013-07-31 ENCOUNTER — Telehealth (HOSPITAL_COMMUNITY): Payer: Self-pay

## 2013-08-02 ENCOUNTER — Other Ambulatory Visit (HOSPITAL_COMMUNITY): Payer: Self-pay | Admitting: Cardiology

## 2013-08-02 ENCOUNTER — Ambulatory Visit: Payer: Medicare Other | Admitting: *Deleted

## 2013-08-02 ENCOUNTER — Ambulatory Visit (HOSPITAL_COMMUNITY)
Admission: RE | Admit: 2013-08-02 | Discharge: 2013-08-02 | Disposition: A | Discharge status: Home / Self Care | Payer: Medicare Other | Source: Ambulatory Visit | Attending: Cardiovascular Disease | Admitting: Cardiovascular Disease

## 2013-08-02 DIAGNOSIS — R55 Syncope and collapse: Secondary | ICD-10-CM | POA: Diagnosis not present

## 2013-08-02 DIAGNOSIS — I2510 Atherosclerotic heart disease of native coronary artery without angina pectoris: Secondary | ICD-10-CM

## 2013-08-02 DIAGNOSIS — I25810 Atherosclerosis of coronary artery bypass graft(s) without angina pectoris: Secondary | ICD-10-CM

## 2013-08-02 NOTE — Progress Notes (Signed)
Monitor placed on patient in office by Wilburn Cornelia, LPN for syncope.

## 2013-08-03 DIAGNOSIS — R55 Syncope and collapse: Secondary | ICD-10-CM | POA: Diagnosis not present

## 2013-08-09 ENCOUNTER — Telehealth (HOSPITAL_COMMUNITY): Payer: Self-pay

## 2013-08-10 ENCOUNTER — Telehealth: Payer: Self-pay | Admitting: Internal Medicine

## 2013-08-10 NOTE — Telephone Encounter (Signed)
monitor

## 2013-08-10 NOTE — Telephone Encounter (Signed)
Show Cardionet strip to Dr Antoine Poche, Patient have a stress test on 6/16 was told by Dr Hudson Lions to have patient wait until after stess test.

## 2013-08-14 ENCOUNTER — Ambulatory Visit (HOSPITAL_COMMUNITY)
Admission: RE | Admit: 2013-08-14 | Discharge: 2013-08-14 | Disposition: A | Discharge status: Home / Self Care | Payer: Medicare Other | Source: Ambulatory Visit | Attending: Cardiology | Admitting: Cardiology

## 2013-08-14 DIAGNOSIS — I428 Other cardiomyopathies: Secondary | ICD-10-CM | POA: Diagnosis not present

## 2013-08-14 DIAGNOSIS — Z9861 Coronary angioplasty status: Secondary | ICD-10-CM | POA: Insufficient documentation

## 2013-08-14 DIAGNOSIS — Z794 Long term (current) use of insulin: Secondary | ICD-10-CM | POA: Diagnosis not present

## 2013-08-14 DIAGNOSIS — I2510 Atherosclerotic heart disease of native coronary artery without angina pectoris: Secondary | ICD-10-CM | POA: Insufficient documentation

## 2013-08-14 DIAGNOSIS — E663 Overweight: Secondary | ICD-10-CM | POA: Insufficient documentation

## 2013-08-14 DIAGNOSIS — E119 Type 2 diabetes mellitus without complications: Secondary | ICD-10-CM | POA: Insufficient documentation

## 2013-08-14 DIAGNOSIS — Z8673 Personal history of transient ischemic attack (TIA), and cerebral infarction without residual deficits: Secondary | ICD-10-CM | POA: Diagnosis not present

## 2013-08-14 DIAGNOSIS — R002 Palpitations: Secondary | ICD-10-CM | POA: Insufficient documentation

## 2013-08-14 DIAGNOSIS — I25810 Atherosclerosis of coronary artery bypass graft(s) without angina pectoris: Secondary | ICD-10-CM | POA: Diagnosis not present

## 2013-08-14 DIAGNOSIS — R42 Dizziness and giddiness: Secondary | ICD-10-CM | POA: Diagnosis not present

## 2013-08-14 DIAGNOSIS — R55 Syncope and collapse: Secondary | ICD-10-CM | POA: Diagnosis not present

## 2013-08-14 DIAGNOSIS — I10 Essential (primary) hypertension: Secondary | ICD-10-CM | POA: Insufficient documentation

## 2013-08-14 MED ORDER — TECHNETIUM TC 99M SESTAMIBI GENERIC - CARDIOLITE
10.0000 | Freq: Once | INTRAVENOUS | Status: AC | PRN
  Administered 2013-08-14: 10 via INTRAVENOUS

## 2013-08-14 MED ORDER — TECHNETIUM TC 99M SESTAMIBI GENERIC - CARDIOLITE
31.0000 | Freq: Once | INTRAVENOUS | Status: AC | PRN
  Administered 2013-08-14: 31 via INTRAVENOUS

## 2013-08-14 MED ORDER — REGADENOSON 0.4 MG/5ML IV SOLN
0.4000 mg | Freq: Once | INTRAVENOUS | Status: AC
  Administered 2013-08-14: 0.4 mg via INTRAVENOUS

## 2013-08-14 NOTE — Procedures (Addendum)
[facility] [address] [place] [address] [phone]  Cardiology Nuclear Med Study  Timothy Warner is a 68 y.o. male     MRN : [id number]     DOB: May 10, 1945  Procedure Date: 08/14/2013  Nuclear Med Background Indication for Stress Test:  Stent Patency, Post Hospital and Abnormal EKG History:  CAD;STENT/PTCA-2003;cardiomyopathy;EF=45%;No prior NUC MPI for comparison. Cardiac Risk Factors: CVA, History of Smoking, Hypertension, IDDM Type 2, Lipids and Overweight  Symptoms:  Dizziness, Palpitations and Syncope   Nuclear Pre-Procedure Caffeine/Decaff Intake:  1:00am NPO After: 11am   IV Site: R Hand  IV 0.9% NS with Angio Cath:  22g  Chest Size (in):  48"  IV Started by: Emmit Pomfret, RN  Height: 5\' 9"  (1.753 m)  Cup Size: n/a  BMI:  Body mass index is 28.64 kg/(m^2). Weight:  194 lb (87.998 kg)   Tech Comments:  n/a    Nuclear Med Study 1 or 2 day study: 1 day  Stress Test Type:  Lexiscan  Order Authorizing Provider:  Rollene Rotunda, MD   Resting Radionuclide: Technetium 63m Sestamibi  Resting Radionuclide Dose: 10.0 mCi   Stress Radionuclide:  Technetium 53m Sestamibi  Stress Radionuclide Dose: 31.0 mCi           Stress Protocol Rest HR: 73 Stress HR: 75  Rest BP: 153/90 Stress BP: 156/91  Exercise Time (min): n/a METS: n/a   Predicted Max HR: 154 bpm % Max HR: 56.49 bpm Rate Pressure Product: 88110  Dose of Adenosine (mg):  n/a Dose of Lexiscan: 0.4 mg  Dose of Atropine (mg): n/a Dose of Dobutamine: n/a mcg/kg/min (at max HR)  Stress Test Technologist: Esperanza Sheets, CCT Nuclear Technologist: Gonzella Lex, CNMT   Rest Procedure:  Myocardial perfusion imaging was performed at rest 45 minutes following the intravenous administration of Technetium 71m Sestamibi. Stress Procedure:  The patient received IV Lexiscan 0.4 mg over 15-seconds.  Technetium 55m Sestamibi injected IV at 30-seconds.  There were no  significant changes with Lexiscan.  Quantitative spect images were obtained after a 45 minute delay.  Transient Ischemic Dilatation (Normal <1.22):  1.25  QGS EDV:  123 ml QGS ESV:  71 ml LV Ejection Fraction: 43%     Rest ECG: NSR with non-specific ST-T wave changes  Stress ECG: No significant change from baseline ECG  QPS Raw Data Images:  Normal; no motion artifact; normal heart/lung ratio. Stress Images:  Normal homogeneous uptake in all areas of the myocardium. Rest Images:  Normal homogeneous uptake in all areas of the myocardium. Subtraction (SDS):  No evidence of ischemia.  Impression Exercise Capacity:  Lexiscan with no exercise. BP Response:  Normal blood pressure response. Clinical Symptoms:  No significant symptoms noted. ECG Impression:  No significant ST segment change suggestive of ischemia. Comparison with Prior Nuclear Study: No previous nuclear study performed  Overall Impression:  Normal stress nuclear study. May need further evaluation of LV dysfunction.   LV Wall Motion:  Moderate global decrease in LV FXN.   Runell Gess, MD  08/14/2013 5:48 PM

## 2013-08-15 ENCOUNTER — Encounter: Payer: Self-pay | Admitting: Internal Medicine

## 2013-08-23 ENCOUNTER — Encounter: Payer: Self-pay | Admitting: *Deleted

## 2013-08-30 NOTE — Telephone Encounter (Signed)
Encounter complete. 

## 2013-09-10 ENCOUNTER — Other Ambulatory Visit (HOSPITAL_COMMUNITY): Payer: Self-pay | Admitting: Cardiology

## 2013-09-10 ENCOUNTER — Other Ambulatory Visit: Payer: Self-pay | Admitting: *Deleted

## 2013-09-10 DIAGNOSIS — R55 Syncope and collapse: Secondary | ICD-10-CM

## 2013-09-10 NOTE — Progress Notes (Signed)
Echo ordered per Dr. Antoine Poche for v tach

## 2013-09-12 ENCOUNTER — Ambulatory Visit (HOSPITAL_COMMUNITY)
Admission: RE | Admit: 2013-09-12 | Discharge: 2013-09-12 | Disposition: A | Discharge status: Home / Self Care | Payer: Medicare Other | Source: Ambulatory Visit | Attending: Internal Medicine | Admitting: Internal Medicine

## 2013-09-12 DIAGNOSIS — I359 Nonrheumatic aortic valve disorder, unspecified: Secondary | ICD-10-CM

## 2013-09-12 DIAGNOSIS — R55 Syncope and collapse: Secondary | ICD-10-CM | POA: Diagnosis not present

## 2013-09-12 NOTE — Progress Notes (Signed)
2D Echocardiogram Complete.  09/12/2013   Allee Busk, RDCS  

## 2013-10-03 ENCOUNTER — Encounter: Payer: Self-pay | Admitting: Cardiology

## 2013-10-03 ENCOUNTER — Ambulatory Visit (INDEPENDENT_AMBULATORY_CARE_PROVIDER_SITE_OTHER): Payer: Medicare Other | Admitting: Cardiology

## 2013-10-03 VITALS — BP 119/80 | HR 108 | Ht 69.0 in | Wt 195.0 lb

## 2013-10-03 DIAGNOSIS — I2510 Atherosclerotic heart disease of native coronary artery without angina pectoris: Secondary | ICD-10-CM

## 2013-10-03 DIAGNOSIS — R55 Syncope and collapse: Secondary | ICD-10-CM | POA: Diagnosis not present

## 2013-10-03 NOTE — Progress Notes (Signed)
HPI The patient presents for followup of coronary disease and a mildly reduced ejection fraction.  I saw him recently for evaluation of syncope. He's had multiple episodes of this with some documentation orthostasis. He had a workup that included an event monitor which demonstrated 1 run of a nonsustained wide complex rhythm. However, he did have symptoms with this. He did have a stress perfusion study which demonstrated no ischemia and an EF of 43%. He subsequently was found however on echo to have a normal EF. He says that since I saw him he's had one more episode of syncope again this was going from a seated to standing position. It always seems to happen like this. He otherwise denies any symptoms. He's had no new shortness of breath, PND or orthopnea. He's not noticing any palpitations. He's not had any chest pressure, neck or arm discomfort.  Allergies  Allergen Reactions  . Ambien [Zolpidem Tartrate] Other (See Comments)    Hallucinations and Altered Mental Status    Current Outpatient Prescriptions  Medication Sig Dispense Refill  . atorvastatin (LIPITOR) 80 MG tablet Take 40 mg by mouth at bedtime. For cholesterol      . cyanocobalamin (,VITAMIN B-12,) 1000 MCG/ML injection Inject 1,000 mcg into the muscle every 30 (thirty) days.      Marland Kitchen dipyridamole-aspirin (AGGRENOX) 200-25 MG per 12 hr capsule Take 1 capsule by mouth 2 (two) times daily.      . insulin glargine (LANTUS) 100 UNIT/ML injection Inject 32 Units into the skin at bedtime.       Marland Kitchen losartan (COZAAR) 100 MG tablet Take 50 mg by mouth daily.      . metFORMIN (GLUCOPHAGE) 1000 MG tablet Take 1,000 mg by mouth 2 (two) times daily with a meal.      . PARoxetine (PAXIL) 40 MG tablet Take 40 mg by mouth every morning.       . thiamine (VITAMIN B-1) 100 MG tablet Take 100 mg by mouth daily.        No current facility-administered medications for this visit.    Past Medical History  Diagnosis Date  . DM (diabetes mellitus)     x8 years  . HTN (hypertension)   . HLD (hyperlipidemia)   . CAD (coronary artery disease)     cypher stenting to the circ and occluded RCA 2003.   . Cardiomyopathy     EF 45%  . Stroke     Past Surgical History  Procedure Laterality Date  . Coronary angioplasty with stent placement       ROS:  Positive for dizziness, vertigo, diarrhea. Otherwise as stated in the HPI and negative for all other systems.  PHYSICAL EXAM BP 119/80  Pulse 108  Ht 5\' 9"  (1.753 m)  Wt 195 lb (88.451 kg)  BMI 28.78 kg/m2 GENERAL:  Well appearing HEENT:  Pupils equal round and reactive, fundi not visualized, oral mucosa unremarkable NECK:  No jugular venous distention, waveform within normal limits, carotid upstroke brisk and symmetric, no bruits, no thyromegaly LYMPHATICS:  No cervical, inguinal adenopathy LUNGS:  Clear to auscultation bilaterally BACK:  No CVA tenderness CHEST:  Unremarkable HEART:  PMI not displaced or sustained,S1 and S2 within normal limits, no S3, no S4, no clicks, no rubs, no murmurs ABD:  Flat, positive bowel sounds normal in frequency in pitch, no bruits, no rebound, no guarding, no midline pulsatile mass, no hepatomegaly, no splenomegaly EXT:  2 plus pulses throughout, no edema, no cyanosis no clubbing SKIN:  No rashes no nodules NEURO:  Cranial nerves II through XII grossly intact, motor grossly intact throughout PSYCH:  Cognitively intact, oriented to person place and time   EKG:  Sinus rhythm, rate 105, low-voltage in the chest leads, poor anterior R wave progression, QTC prolongation, premature atrial contractions, no acute ST-T wave changes.  10/03/2013  ASSESSMENT AND PLAN  SYNCOPE:  I suspect that this is related to orthostasis.  He was very orthostatic in the office today..   I do not think this is related to what appears to be in idioventricular rhythm. He does have symptoms consistent with orthostasis. I gave him a prescription for knee-high compression  stockings.  CAD:  A negative stress perfusion study. No further evaluation is indicated.  DM:   He reports his last A1c was 7.5. [facility]  HTN:  His blood pressure is controlled he will continue the meds as listed.  HYPERLIPIDEMIA:  He is on target statin. He needs better control of his diabetes for his triglyceride management and again this [facility]  CARDIOMYOPATHY:  His EF was well-preserved on echo. No further evaluation is planned.

## 2013-10-03 NOTE — Patient Instructions (Signed)
Your physician recommends that you schedule a follow-up appointment in: 6 months with Dr. Antoine Poche  Use your compression stockings as directed

## 2013-10-04 ENCOUNTER — Encounter: Payer: Self-pay | Admitting: Cardiology

## 2014-03-04 ENCOUNTER — Emergency Department (HOSPITAL_COMMUNITY)
Admission: EM | Admit: 2014-03-04 | Discharge: 2014-03-04 | Disposition: A | Discharge status: Home / Self Care | Payer: Medicare Other | Attending: Emergency Medicine | Admitting: Emergency Medicine

## 2014-03-04 DIAGNOSIS — I10 Essential (primary) hypertension: Secondary | ICD-10-CM | POA: Insufficient documentation

## 2014-03-04 DIAGNOSIS — Z79899 Other long term (current) drug therapy: Secondary | ICD-10-CM | POA: Insufficient documentation

## 2014-03-04 DIAGNOSIS — E785 Hyperlipidemia, unspecified: Secondary | ICD-10-CM | POA: Insufficient documentation

## 2014-03-04 DIAGNOSIS — E119 Type 2 diabetes mellitus without complications: Secondary | ICD-10-CM | POA: Diagnosis not present

## 2014-03-04 DIAGNOSIS — Z8673 Personal history of transient ischemic attack (TIA), and cerebral infarction without residual deficits: Secondary | ICD-10-CM | POA: Insufficient documentation

## 2014-03-04 DIAGNOSIS — Z794 Long term (current) use of insulin: Secondary | ICD-10-CM | POA: Diagnosis not present

## 2014-03-04 DIAGNOSIS — Z9861 Coronary angioplasty status: Secondary | ICD-10-CM | POA: Insufficient documentation

## 2014-03-04 DIAGNOSIS — Z87891 Personal history of nicotine dependence: Secondary | ICD-10-CM | POA: Diagnosis not present

## 2014-03-04 DIAGNOSIS — F10188 Alcohol abuse with other alcohol-induced disorder: Secondary | ICD-10-CM | POA: Insufficient documentation

## 2014-03-04 DIAGNOSIS — R410 Disorientation, unspecified: Secondary | ICD-10-CM | POA: Diagnosis present

## 2014-03-04 DIAGNOSIS — I2510 Atherosclerotic heart disease of native coronary artery without angina pectoris: Secondary | ICD-10-CM | POA: Diagnosis not present

## 2014-03-04 LAB — CBC WITH DIFFERENTIAL/PLATELET
Basophils Absolute: 0 10*3/uL (ref 0.0–0.1)
Basophils Relative: 0 % (ref 0–1)
Eosinophils Absolute: 0 10*3/uL (ref 0.0–0.7)
Eosinophils Relative: 0 % (ref 0–5)
HCT: 36.5 % — ABNORMAL LOW (ref 39.0–52.0)
Hemoglobin: 12.3 g/dL — ABNORMAL LOW (ref 13.0–17.0)
Lymphocytes Relative: 47 % — ABNORMAL HIGH (ref 12–46)
Lymphs Abs: 6.3 10*3/uL — ABNORMAL HIGH (ref 0.7–4.0)
MCH: 31.3 pg (ref 26.0–34.0)
MCHC: 33.7 g/dL (ref 30.0–36.0)
MCV: 92.9 fL (ref 78.0–100.0)
MONO ABS: 0.9 10*3/uL (ref 0.1–1.0)
Monocytes Relative: 7 % (ref 3–12)
NEUTROS PCT: 46 % (ref 43–77)
Neutro Abs: 6.1 10*3/uL (ref 1.7–7.7)
PLATELETS: 314 10*3/uL (ref 150–400)
RBC: 3.93 MIL/uL — AB (ref 4.22–5.81)
RDW: 13.4 % (ref 11.5–15.5)
WBC: 13.3 10*3/uL — AB (ref 4.0–10.5)

## 2014-03-04 LAB — COMPREHENSIVE METABOLIC PANEL
ALT: 35 U/L (ref 0–53)
ANION GAP: 12 (ref 5–15)
AST: 44 U/L — ABNORMAL HIGH (ref 0–37)
Albumin: 4.4 g/dL (ref 3.5–5.2)
Alkaline Phosphatase: 48 U/L (ref 39–117)
BUN: 18 mg/dL (ref 6–23)
CALCIUM: 9.6 mg/dL (ref 8.4–10.5)
CO2: 24 mmol/L (ref 19–32)
Chloride: 92 mEq/L — ABNORMAL LOW (ref 96–112)
Creatinine, Ser: 2.25 mg/dL — ABNORMAL HIGH (ref 0.50–1.35)
GFR, EST AFRICAN AMERICAN: 33 mL/min — AB (ref >90)
GFR, EST NON AFRICAN AMERICAN: 28 mL/min — AB (ref >90)
GLUCOSE: 152 mg/dL — AB (ref 70–99)
Potassium: 4.8 mmol/L (ref 3.5–5.1)
Sodium: 128 mmol/L — ABNORMAL LOW (ref 135–145)
Total Bilirubin: 1.2 mg/dL (ref 0.3–1.2)
Total Protein: 7.4 g/dL (ref 6.0–8.3)

## 2014-03-04 LAB — ETHANOL: Alcohol, Ethyl (B): <5 mg/dL (ref 0–9)

## 2014-03-04 MED ORDER — ONDANSETRON HCL 4 MG PO TABS: 4.0000 mg | ORAL_TABLET | Freq: Three times a day (TID) | ORAL | Status: DC | PRN

## 2014-03-04 MED ORDER — LORAZEPAM 1 MG PO TABS
0.0000 mg | ORAL_TABLET | Freq: Four times a day (QID) | ORAL | Status: DC
  Administered 2014-03-04: 1 mg via ORAL
  Filled 2014-03-04: qty 1

## 2014-03-04 MED ORDER — THIAMINE HCL 100 MG/ML IJ SOLN: 100.0000 mg | Freq: Every day | INTRAMUSCULAR | Status: DC

## 2014-03-04 MED ORDER — VITAMIN B-1 100 MG PO TABS
100.0000 mg | ORAL_TABLET | Freq: Every day | ORAL | Status: DC
  Administered 2014-03-04: 100 mg via ORAL
  Filled 2014-03-04: qty 1

## 2014-03-04 MED ORDER — ALUM & MAG HYDROXIDE-SIMETH 200-200-20 MG/5ML PO SUSP: 30.0000 mL | ORAL | Status: DC | PRN

## 2014-03-04 MED ORDER — LORAZEPAM 1 MG PO TABS: 0.0000 mg | ORAL_TABLET | Freq: Two times a day (BID) | ORAL | Status: DC

## 2014-03-04 MED ORDER — IBUPROFEN 200 MG PO TABS: 600.0000 mg | ORAL_TABLET | Freq: Three times a day (TID) | ORAL | Status: DC | PRN

## 2014-03-04 MED ORDER — LORAZEPAM 1 MG PO TABS: 1.0000 mg | ORAL_TABLET | Freq: Four times a day (QID) | ORAL | Status: AC | PRN

## 2014-03-04 MED ORDER — NICOTINE 21 MG/24HR TD PT24
21.0000 mg | MEDICATED_PATCH | Freq: Every day | TRANSDERMAL | Status: DC
  Administered 2014-03-04: 21 mg via TRANSDERMAL
  Filled 2014-03-04: qty 1

## 2014-03-04 NOTE — ED Notes (Signed)
Bed: VZ48 Expected date:  Expected time:  Means of arrival:  Comments: EMS- ETOH withdrawl

## 2014-03-04 NOTE — Discharge Instructions (Signed)
Alcohol Withdrawal °Anytime drug use is interfering with normal living activities it has become abuse. This includes problems with family and friends. Psychological dependence has developed when your mind tells you that the drug is needed. This is usually followed by physical dependence when a continuing increase of drugs are required to get the same feeling or "high." This is known as addiction or chemical dependency. A person's risk is much higher if there is a history of chemical dependency in the family. °Mild Withdrawal Following Stopping Alcohol, When Addiction or Chemical Dependency Has Developed °When a person has developed tolerance to alcohol, any sudden stopping of alcohol can cause uncomfortable physical symptoms. Most of the time these are mild and consist of tremors in the hands and increases in heart rate, breathing, and temperature. Sometimes these symptoms are associated with anxiety, panic attacks, and bad dreams. There may also be stomach upset. Normal sleep patterns are often interrupted with periods of inability to sleep (insomnia). This may last for 6 months. Because of this discomfort, many people choose to continue drinking to get rid of this discomfort and to try to feel normal. °Severe Withdrawal with Decreased or No Alcohol Intake, When Addiction or Chemical Dependency Has Developed °About five percent of alcoholics will develop signs of severe withdrawal when they stop using alcohol. One sign of this is development of generalized seizures (convulsions). Other signs of this are severe agitation and confusion. This may be associated with believing in things which are not real or seeing things which are not really there (delusions and hallucinations). Vitamin deficiencies are usually present if alcohol intake has been long-term. Treatment for this most often requires hospitalization and close observation. °Addiction can only be helped by stopping use of all chemicals. This is hard but may  save your life. With continual alcohol use, possible outcomes are usually loss of self respect and esteem, violence, and death. °Addiction cannot be cured but it can be stopped. This often requires outside help and the care of professionals. Treatment centers are listed in the yellow pages under Cocaine, Narcotics, and Alcoholics Anonymous. Most hospitals and clinics can refer you to a specialized care center. °It is not necessary for you to go through the uncomfortable symptoms of withdrawal. Your caregiver can provide you with medicines that will help you through this difficult period. Try to avoid situations, friends, or drugs that made it possible for you to keep using alcohol in the past. Learn how to say no. °It takes a long period of time to overcome addictions to all drugs, including alcohol. There may be many times when you feel as though you want a drink. After getting rid of the physical addiction and withdrawal, you will have a lessening of the craving which tells you that you need alcohol to feel normal. Call your caregiver if more support is needed. Learn who to talk to in your family and among your friends so that during these periods you can receive outside help. Alcoholics Anonymous (AA) has helped many people over the years. To get further help, contact AA or call your caregiver, counselor, or clergyperson. Al-Anon and Alateen are support groups for friends and family members of an alcoholic. The people who love and care for an alcoholic often need help, too. For information about these organizations, check your phone directory or call a local alcoholism treatment center.  °SEEK IMMEDIATE MEDICAL CARE IF:  °· You have a seizure. °· You have a fever. °· You experience uncontrolled vomiting or you   vomit up blood. This may be bright red or look like black coffee grounds. °· You have blood in the stool. This may be bright red or appear as a black, tarry, bad-smelling stool. °· You become lightheaded or  faint. Do not drive if you feel this way. Have someone else drive you or call 911 for help. °· You become more agitated or confused. °· You develop uncontrolled anxiety. °· You begin to see things that are not really there (hallucinate). °Your caregiver has determined that you completely understand your medical condition, and that your mental state is back to normal. You understand that you have been treated for alcohol withdrawal, have agreed not to drink any alcohol for a minimum of 1 day, will not operate a car or other machinery for 24 hours, and have had an opportunity to ask any questions about your condition. °Document Released: 11/25/2004 Document Revised: 05/10/2011 Document Reviewed: 10/04/2007 °Exit[employer]® Patient Information ©2015 [employer]. This information is not intended to replace advice given to you by your health care provider. Make sure you discuss any questions you have with your health care provider. ° °

## 2014-03-04 NOTE — ED Provider Notes (Addendum)
CSN: [id number]     Arrival date & time 03/04/14  1302 History   First MD Initiated Contact with Patient 03/04/14 1306     Chief Complaint  Patient presents with  . Delirium Tremens (DTS)      HPI Patient has a history of prior alcohol abuse.  He stopped drinking 3 days ago but over the past week to 2 weeks has been drinking heavily again.  He attempted to stop in his had no drink for 48 hours.  He awoke with shaking of his arms and legs that worried him and his family.  No seizures.  No altered mental status.  Denies nausea vomiting.  No hallucinations.  He was concerned about the possibility of alcohol withdrawal and thus he came to the ER for evaluation.  He wants medications to help him with alcohol withdrawal but he does not want to be admitted to the hospital nor does he want to be admitted to a detox facility.  He would prefer to do this at home as an outpatient.   Past Medical History  Diagnosis Date  . DM (diabetes mellitus)     x8 years  . HTN (hypertension)   . HLD (hyperlipidemia)   . CAD (coronary artery disease)     cypher stenting to the circ and occluded RCA 2003.   . Cardiomyopathy     EF 45%  . Stroke    Past Surgical History  Procedure Laterality Date  . Coronary angioplasty with stent placement     Family History  Problem Relation Age of Onset  . Stroke      family hx  . Diabetes      family hx  . Hypertension      family hx    History  Substance Use Topics  . Smoking status: Former Games developer  . Smokeless tobacco: Not on file  . Alcohol Use: No    Review of Systems  All other systems reviewed and are negative.     Allergies  Ambien  Home Medications   Prior to Admission medications   Medication Sig Start Date End Date Taking? Authorizing Provider  atorvastatin (LIPITOR) 80 MG tablet Take 40 mg by mouth at bedtime. For cholesterol   Yes Historical Provider, MD  cyanocobalamin (,VITAMIN B-12,) 1000 MCG/ML injection Inject 1,000 mcg into the  muscle every 30 (thirty) days.   Yes Historical Provider, MD  dipyridamole-aspirin (AGGRENOX) 200-25 MG per 12 hr capsule Take 1 capsule by mouth 2 (two) times daily.   Yes Historical Provider, MD  ergocalciferol (VITAMIN D2) 50000 UNITS capsule Take 50,000 Units by mouth once a week. saturday   Yes Historical Provider, MD  insulin aspart (NOVOLOG) 100 UNIT/ML injection Inject 2-12 Units into the skin 3 (three) times daily between meals as needed for high blood sugar. Sliding scale   Yes Historical Provider, MD  insulin glargine (LANTUS) 100 UNIT/ML injection Inject 32 Units into the skin at bedtime.    Yes Historical Provider, MD  losartan (COZAAR) 100 MG tablet Take 50 mg by mouth daily.   Yes Historical Provider, MD  metFORMIN (GLUCOPHAGE) 1000 MG tablet Take 1,000 mg by mouth 2 (two) times daily with a meal.   Yes Historical Provider, MD  PARoxetine (PAXIL) 40 MG tablet Take 40 mg by mouth every morning.    Yes Historical Provider, MD  thiamine (VITAMIN B-1) 100 MG tablet Take 100 mg by mouth daily.    Yes Historical Provider, MD  LORazepam (ATIVAN) 1 MG  tablet Take 1 tablet (1 mg total) by mouth every 6 (six) hours as needed (withdrawl symptoms). 03/04/14   Lyanne Co, MD   BP 141/73 mmHg  Pulse 115  Temp(Src) 98.4 F (36.9 C) (Oral)  Resp 18  SpO2 97% Physical Exam  Constitutional: He is oriented to person, place, and time. He appears well-developed and well-nourished.  HENT:  Head: Normocephalic and atraumatic.  Eyes: EOM are normal.  Neck: Normal range of motion.  Cardiovascular: Normal rate, regular rhythm, normal heart sounds and intact distal pulses.   Pulmonary/Chest: Effort normal and breath sounds normal. No respiratory distress.  Abdominal: Soft. He exhibits no distension. There is no tenderness.  Musculoskeletal: Normal range of motion.  Neurological: He is alert and oriented to person, place, and time.  Skin: Skin is warm and dry.  Psychiatric: He has a normal mood and  affect. Judgment normal.  Nursing note and vitals reviewed.   ED Course  Procedures (including critical care time) Labs Review Labs Reviewed  CBC WITH DIFFERENTIAL - Abnormal; Notable for the following:    WBC 13.3 (*)    RBC 3.93 (*)    Hemoglobin 12.3 (*)    HCT 36.5 (*)    Lymphocytes Relative 47 (*)    Lymphs Abs 6.3 (*)    All other components within normal limits  COMPREHENSIVE METABOLIC PANEL - Abnormal; Notable for the following:    Sodium 128 (*)    Chloride 92 (*)    Glucose, Bld 152 (*)    Creatinine, Ser 2.25 (*)    AST 44 (*)    GFR calc non Af Amer 28 (*)    GFR calc Af Amer 33 (*)    All other components within normal limits  ETHANOL  URINE RAPID DRUG SCREEN (HOSP PERFORMED)  I personally reviewed the imaging tests through PACS system I reviewed available ER/hospitalization records through the EMR   BUN  Date Value Ref Range Status  03/04/2014 18 6 - 23 mg/dL Final  16/11/9602 8 6 - 23 mg/dL Final  54/10/8117 11 6 - 23 mg/dL Final  14/78/2956 12 6 - 23 mg/dL Final   CREATININE, SER  Date Value Ref Range Status  03/04/2014 2.25* 0.50 - 1.35 mg/dL Final  21/30/8657 8.46 0.50 - 1.35 mg/dL Final  96/29/5284 1.32 0.50 - 1.35 mg/dL Final  44/03/270 5.36 0.50 - 1.35 mg/dL Final          Imaging Review No results found.   EKG Interpretation None      MDM   Final diagnoses:  Alcohol abuse with other alcohol-induced disorder    CIWA score was 4 on arrival.  Patient was given a single dose of Ativan.  He was given IV fluids.  He is feeling much better at this time.  Renal insufficiency is worse than baseline.  He will follow-up with his doctor to have his creatinine rechecked in 48 hours.  If he is unable to follow-up with his doctor I've asked that he return to the ER for repeat check of his renal function in 48 hours.  I've asked that he drink lots of fluids at home.  Again he does not want to be admitted.   I'll prescribe a short course of  benzodiazepines for him to continue working on alcohol cessation at home.  He is with his family.  They are agreeable as well.  Asked that he return to the ER for new or worsening symptoms.   3:56 PM I spoke  with another family member who showed up who also agrees that he needs to be admitted.  She states she's been lightheaded.  I informed again the patient that I would prefer to keep him in the hospital so he can complete his alcohol withdrawal process with the appropriate supervision as well as monitor his kidney function which seems to be worse than several years ago.  He understands the risk of going home which includes the possibility of seizure as well as worsening kidney function.  Despite this he would still like to go home at this time.  I spoke with his family members who understand that we are unable to force him into the hospital.    Lyanne Co, MD 03/04/14 1552  Lyanne Co, MD 03/04/14 [phone]

## 2014-03-04 NOTE — ED Notes (Addendum)
Patient is aware we need urine-unable to go at this time, will let us know when he is able to.

## 2014-03-04 NOTE — ED Notes (Signed)
Pt states he has not had any alcohol to drink in the past two days. Pt states he normally drinks 1/2 gallon of liquor every three days. Pt states he quit drinking three years ago, but "fell off the wagon" over the holidays.

## 2014-03-05 LAB — PATHOLOGIST SMEAR REVIEW

## 2014-03-11 DIAGNOSIS — R269 Unspecified abnormalities of gait and mobility: Secondary | ICD-10-CM | POA: Diagnosis not present

## 2014-03-11 DIAGNOSIS — R627 Adult failure to thrive: Secondary | ICD-10-CM | POA: Diagnosis not present

## 2014-03-11 DIAGNOSIS — E119 Type 2 diabetes mellitus without complications: Secondary | ICD-10-CM | POA: Diagnosis not present

## 2014-03-11 DIAGNOSIS — R4589 Other symptoms and signs involving emotional state: Secondary | ICD-10-CM | POA: Diagnosis not present

## 2014-03-11 DIAGNOSIS — R296 Repeated falls: Secondary | ICD-10-CM | POA: Diagnosis not present

## 2014-03-11 DIAGNOSIS — I2510 Atherosclerotic heart disease of native coronary artery without angina pectoris: Secondary | ICD-10-CM | POA: Diagnosis not present

## 2014-03-13 DIAGNOSIS — I2510 Atherosclerotic heart disease of native coronary artery without angina pectoris: Secondary | ICD-10-CM | POA: Diagnosis not present

## 2014-03-13 DIAGNOSIS — R296 Repeated falls: Secondary | ICD-10-CM | POA: Diagnosis not present

## 2014-03-13 DIAGNOSIS — R4589 Other symptoms and signs involving emotional state: Secondary | ICD-10-CM | POA: Diagnosis not present

## 2014-03-13 DIAGNOSIS — R627 Adult failure to thrive: Secondary | ICD-10-CM | POA: Diagnosis not present

## 2014-03-13 DIAGNOSIS — R269 Unspecified abnormalities of gait and mobility: Secondary | ICD-10-CM | POA: Diagnosis not present

## 2014-03-13 DIAGNOSIS — E119 Type 2 diabetes mellitus without complications: Secondary | ICD-10-CM | POA: Diagnosis not present

## 2014-03-14 DIAGNOSIS — R269 Unspecified abnormalities of gait and mobility: Secondary | ICD-10-CM | POA: Diagnosis not present

## 2014-03-14 DIAGNOSIS — R627 Adult failure to thrive: Secondary | ICD-10-CM | POA: Diagnosis not present

## 2014-03-14 DIAGNOSIS — R296 Repeated falls: Secondary | ICD-10-CM | POA: Diagnosis not present

## 2014-03-14 DIAGNOSIS — E119 Type 2 diabetes mellitus without complications: Secondary | ICD-10-CM | POA: Diagnosis not present

## 2014-03-14 DIAGNOSIS — R4589 Other symptoms and signs involving emotional state: Secondary | ICD-10-CM | POA: Diagnosis not present

## 2014-03-14 DIAGNOSIS — I2510 Atherosclerotic heart disease of native coronary artery without angina pectoris: Secondary | ICD-10-CM | POA: Diagnosis not present

## 2014-03-18 DIAGNOSIS — E119 Type 2 diabetes mellitus without complications: Secondary | ICD-10-CM | POA: Diagnosis not present

## 2014-03-18 DIAGNOSIS — I2510 Atherosclerotic heart disease of native coronary artery without angina pectoris: Secondary | ICD-10-CM | POA: Diagnosis not present

## 2014-03-18 DIAGNOSIS — R4589 Other symptoms and signs involving emotional state: Secondary | ICD-10-CM | POA: Diagnosis not present

## 2014-03-18 DIAGNOSIS — R296 Repeated falls: Secondary | ICD-10-CM | POA: Diagnosis not present

## 2014-03-18 DIAGNOSIS — R627 Adult failure to thrive: Secondary | ICD-10-CM | POA: Diagnosis not present

## 2014-03-18 DIAGNOSIS — R269 Unspecified abnormalities of gait and mobility: Secondary | ICD-10-CM | POA: Diagnosis not present

## 2014-03-21 DIAGNOSIS — R296 Repeated falls: Secondary | ICD-10-CM | POA: Diagnosis not present

## 2014-03-21 DIAGNOSIS — R269 Unspecified abnormalities of gait and mobility: Secondary | ICD-10-CM | POA: Diagnosis not present

## 2014-03-21 DIAGNOSIS — R627 Adult failure to thrive: Secondary | ICD-10-CM | POA: Diagnosis not present

## 2014-03-21 DIAGNOSIS — E119 Type 2 diabetes mellitus without complications: Secondary | ICD-10-CM | POA: Diagnosis not present

## 2014-03-21 DIAGNOSIS — I2510 Atherosclerotic heart disease of native coronary artery without angina pectoris: Secondary | ICD-10-CM | POA: Diagnosis not present

## 2014-03-21 DIAGNOSIS — R4589 Other symptoms and signs involving emotional state: Secondary | ICD-10-CM | POA: Diagnosis not present

## 2014-03-25 DIAGNOSIS — R627 Adult failure to thrive: Secondary | ICD-10-CM | POA: Diagnosis not present

## 2014-03-25 DIAGNOSIS — R269 Unspecified abnormalities of gait and mobility: Secondary | ICD-10-CM | POA: Diagnosis not present

## 2014-03-25 DIAGNOSIS — E119 Type 2 diabetes mellitus without complications: Secondary | ICD-10-CM | POA: Diagnosis not present

## 2014-03-25 DIAGNOSIS — R296 Repeated falls: Secondary | ICD-10-CM | POA: Diagnosis not present

## 2014-03-25 DIAGNOSIS — I2510 Atherosclerotic heart disease of native coronary artery without angina pectoris: Secondary | ICD-10-CM | POA: Diagnosis not present

## 2014-03-25 DIAGNOSIS — R4589 Other symptoms and signs involving emotional state: Secondary | ICD-10-CM | POA: Diagnosis not present

## 2014-03-27 DIAGNOSIS — E119 Type 2 diabetes mellitus without complications: Secondary | ICD-10-CM | POA: Diagnosis not present

## 2014-03-27 DIAGNOSIS — R296 Repeated falls: Secondary | ICD-10-CM | POA: Diagnosis not present

## 2014-03-27 DIAGNOSIS — R269 Unspecified abnormalities of gait and mobility: Secondary | ICD-10-CM | POA: Diagnosis not present

## 2014-03-27 DIAGNOSIS — R627 Adult failure to thrive: Secondary | ICD-10-CM | POA: Diagnosis not present

## 2014-03-27 DIAGNOSIS — R4589 Other symptoms and signs involving emotional state: Secondary | ICD-10-CM | POA: Diagnosis not present

## 2014-03-27 DIAGNOSIS — I2510 Atherosclerotic heart disease of native coronary artery without angina pectoris: Secondary | ICD-10-CM | POA: Diagnosis not present

## 2014-04-02 DIAGNOSIS — I2510 Atherosclerotic heart disease of native coronary artery without angina pectoris: Secondary | ICD-10-CM | POA: Diagnosis not present

## 2014-04-02 DIAGNOSIS — R4589 Other symptoms and signs involving emotional state: Secondary | ICD-10-CM | POA: Diagnosis not present

## 2014-04-02 DIAGNOSIS — R627 Adult failure to thrive: Secondary | ICD-10-CM | POA: Diagnosis not present

## 2014-04-02 DIAGNOSIS — R269 Unspecified abnormalities of gait and mobility: Secondary | ICD-10-CM | POA: Diagnosis not present

## 2014-04-02 DIAGNOSIS — R296 Repeated falls: Secondary | ICD-10-CM | POA: Diagnosis not present

## 2014-04-02 DIAGNOSIS — E119 Type 2 diabetes mellitus without complications: Secondary | ICD-10-CM | POA: Diagnosis not present

## 2014-04-04 DIAGNOSIS — R296 Repeated falls: Secondary | ICD-10-CM | POA: Diagnosis not present

## 2014-04-04 DIAGNOSIS — E119 Type 2 diabetes mellitus without complications: Secondary | ICD-10-CM | POA: Diagnosis not present

## 2014-04-04 DIAGNOSIS — R627 Adult failure to thrive: Secondary | ICD-10-CM | POA: Diagnosis not present

## 2014-04-04 DIAGNOSIS — R269 Unspecified abnormalities of gait and mobility: Secondary | ICD-10-CM | POA: Diagnosis not present

## 2014-04-04 DIAGNOSIS — I2510 Atherosclerotic heart disease of native coronary artery without angina pectoris: Secondary | ICD-10-CM | POA: Diagnosis not present

## 2014-04-04 DIAGNOSIS — R4589 Other symptoms and signs involving emotional state: Secondary | ICD-10-CM | POA: Diagnosis not present

## 2014-04-09 DIAGNOSIS — R269 Unspecified abnormalities of gait and mobility: Secondary | ICD-10-CM | POA: Diagnosis not present

## 2014-04-09 DIAGNOSIS — R296 Repeated falls: Secondary | ICD-10-CM | POA: Diagnosis not present

## 2014-04-09 DIAGNOSIS — I2510 Atherosclerotic heart disease of native coronary artery without angina pectoris: Secondary | ICD-10-CM | POA: Diagnosis not present

## 2014-04-09 DIAGNOSIS — E119 Type 2 diabetes mellitus without complications: Secondary | ICD-10-CM | POA: Diagnosis not present

## 2014-04-09 DIAGNOSIS — R4589 Other symptoms and signs involving emotional state: Secondary | ICD-10-CM | POA: Diagnosis not present

## 2014-04-09 DIAGNOSIS — R627 Adult failure to thrive: Secondary | ICD-10-CM | POA: Diagnosis not present

## 2014-04-11 DIAGNOSIS — R627 Adult failure to thrive: Secondary | ICD-10-CM | POA: Diagnosis not present

## 2014-04-11 DIAGNOSIS — R269 Unspecified abnormalities of gait and mobility: Secondary | ICD-10-CM | POA: Diagnosis not present

## 2014-04-11 DIAGNOSIS — E119 Type 2 diabetes mellitus without complications: Secondary | ICD-10-CM | POA: Diagnosis not present

## 2014-04-11 DIAGNOSIS — R296 Repeated falls: Secondary | ICD-10-CM | POA: Diagnosis not present

## 2014-04-11 DIAGNOSIS — I2510 Atherosclerotic heart disease of native coronary artery without angina pectoris: Secondary | ICD-10-CM | POA: Diagnosis not present

## 2014-04-11 DIAGNOSIS — R4589 Other symptoms and signs involving emotional state: Secondary | ICD-10-CM | POA: Diagnosis not present

## 2014-04-16 DIAGNOSIS — R296 Repeated falls: Secondary | ICD-10-CM | POA: Diagnosis not present

## 2014-04-16 DIAGNOSIS — I2510 Atherosclerotic heart disease of native coronary artery without angina pectoris: Secondary | ICD-10-CM | POA: Diagnosis not present

## 2014-04-16 DIAGNOSIS — R269 Unspecified abnormalities of gait and mobility: Secondary | ICD-10-CM | POA: Diagnosis not present

## 2014-04-16 DIAGNOSIS — R627 Adult failure to thrive: Secondary | ICD-10-CM | POA: Diagnosis not present

## 2014-04-16 DIAGNOSIS — E119 Type 2 diabetes mellitus without complications: Secondary | ICD-10-CM | POA: Diagnosis not present

## 2014-04-16 DIAGNOSIS — R4589 Other symptoms and signs involving emotional state: Secondary | ICD-10-CM | POA: Diagnosis not present

## 2014-04-18 DIAGNOSIS — R269 Unspecified abnormalities of gait and mobility: Secondary | ICD-10-CM | POA: Diagnosis not present

## 2014-04-18 DIAGNOSIS — R296 Repeated falls: Secondary | ICD-10-CM | POA: Diagnosis not present

## 2014-04-18 DIAGNOSIS — I2510 Atherosclerotic heart disease of native coronary artery without angina pectoris: Secondary | ICD-10-CM | POA: Diagnosis not present

## 2014-04-18 DIAGNOSIS — R4589 Other symptoms and signs involving emotional state: Secondary | ICD-10-CM | POA: Diagnosis not present

## 2014-04-18 DIAGNOSIS — E119 Type 2 diabetes mellitus without complications: Secondary | ICD-10-CM | POA: Diagnosis not present

## 2014-04-18 DIAGNOSIS — R627 Adult failure to thrive: Secondary | ICD-10-CM | POA: Diagnosis not present

## 2014-04-23 DIAGNOSIS — R627 Adult failure to thrive: Secondary | ICD-10-CM | POA: Diagnosis not present

## 2014-04-23 DIAGNOSIS — I2510 Atherosclerotic heart disease of native coronary artery without angina pectoris: Secondary | ICD-10-CM | POA: Diagnosis not present

## 2014-04-23 DIAGNOSIS — E119 Type 2 diabetes mellitus without complications: Secondary | ICD-10-CM | POA: Diagnosis not present

## 2014-04-23 DIAGNOSIS — R4589 Other symptoms and signs involving emotional state: Secondary | ICD-10-CM | POA: Diagnosis not present

## 2014-04-23 DIAGNOSIS — R296 Repeated falls: Secondary | ICD-10-CM | POA: Diagnosis not present

## 2014-04-23 DIAGNOSIS — R269 Unspecified abnormalities of gait and mobility: Secondary | ICD-10-CM | POA: Diagnosis not present

## 2014-04-25 DIAGNOSIS — R269 Unspecified abnormalities of gait and mobility: Secondary | ICD-10-CM | POA: Diagnosis not present

## 2014-04-25 DIAGNOSIS — R296 Repeated falls: Secondary | ICD-10-CM | POA: Diagnosis not present

## 2014-04-25 DIAGNOSIS — R627 Adult failure to thrive: Secondary | ICD-10-CM | POA: Diagnosis not present

## 2014-04-25 DIAGNOSIS — R4589 Other symptoms and signs involving emotional state: Secondary | ICD-10-CM | POA: Diagnosis not present

## 2014-04-25 DIAGNOSIS — I2510 Atherosclerotic heart disease of native coronary artery without angina pectoris: Secondary | ICD-10-CM | POA: Diagnosis not present

## 2014-04-25 DIAGNOSIS — E119 Type 2 diabetes mellitus without complications: Secondary | ICD-10-CM | POA: Diagnosis not present

## 2014-05-03 DIAGNOSIS — R269 Unspecified abnormalities of gait and mobility: Secondary | ICD-10-CM | POA: Diagnosis not present

## 2014-05-03 DIAGNOSIS — R627 Adult failure to thrive: Secondary | ICD-10-CM | POA: Diagnosis not present

## 2014-05-03 DIAGNOSIS — R4589 Other symptoms and signs involving emotional state: Secondary | ICD-10-CM | POA: Diagnosis not present

## 2014-05-03 DIAGNOSIS — E119 Type 2 diabetes mellitus without complications: Secondary | ICD-10-CM | POA: Diagnosis not present

## 2014-05-03 DIAGNOSIS — R296 Repeated falls: Secondary | ICD-10-CM | POA: Diagnosis not present

## 2014-05-03 DIAGNOSIS — I2510 Atherosclerotic heart disease of native coronary artery without angina pectoris: Secondary | ICD-10-CM | POA: Diagnosis not present

## 2014-11-24 IMAGING — CT CT HEAD W/O CM
2 series · 16 of 30 positions shown, 18 images · non-contrast
Comparison: 05/01/2011

CLINICAL DATA: Loss of consciousness

EXAM:
CT HEAD WITHOUT CONTRAST
TECHNIQUE: Contiguous axial images were obtained from the base of the skull
through the vertex without intravenous contrast.

[Series 2: head w/o · axial · non-contrast · 0.45mm/px · z∈[+280,+405]mm · 8 of 33 slices shown, 10 images]
[im 4/33  brain]
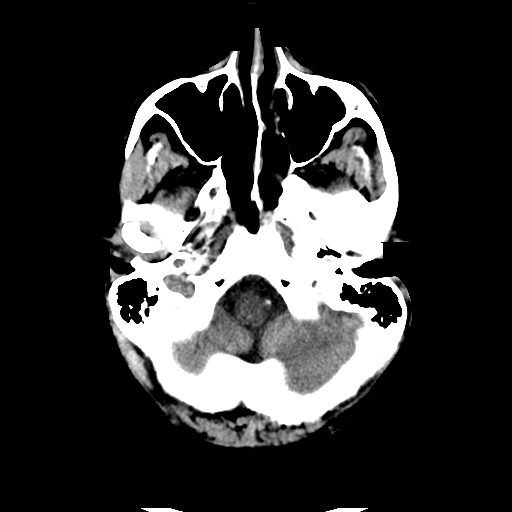
[im 4/33  bone]
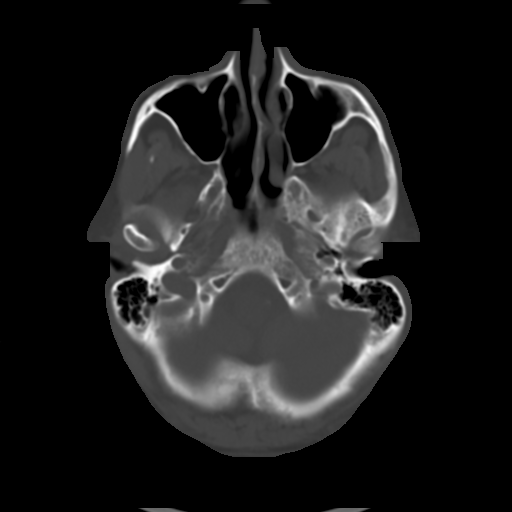
[im 8/33  brain]
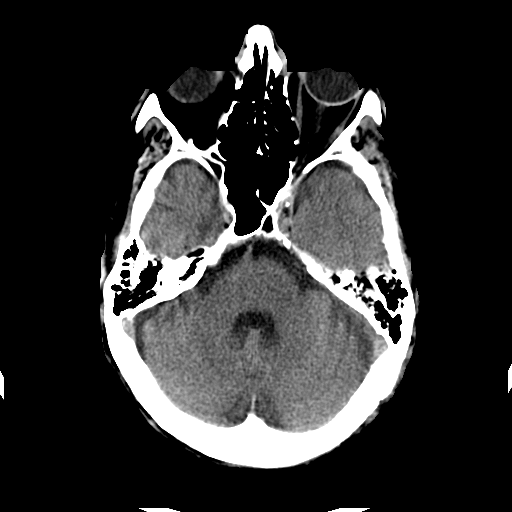
[im 11/33  brain]
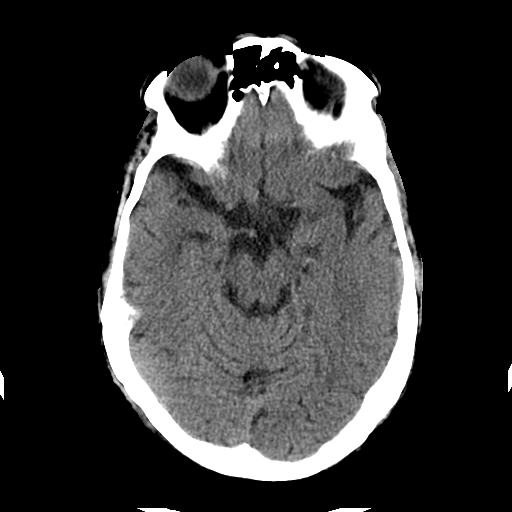
[im 15/33  brain]
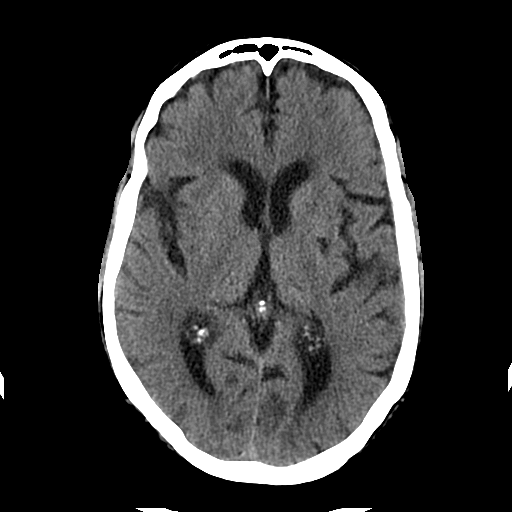
[im 18/33  brain]
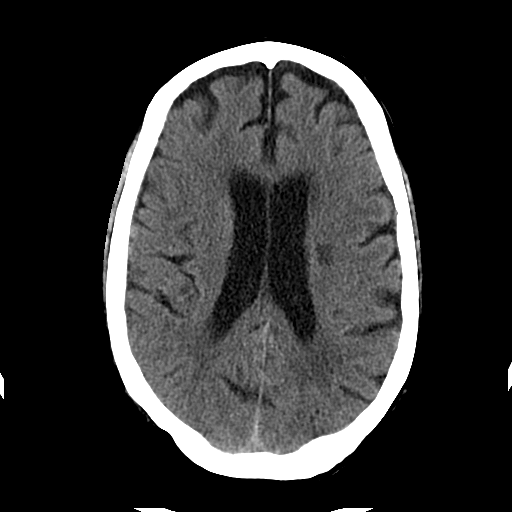
[im 18/33  bone]
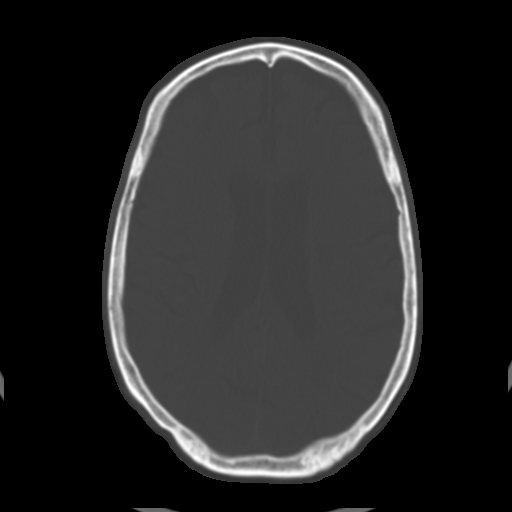
[im 22/33  brain]
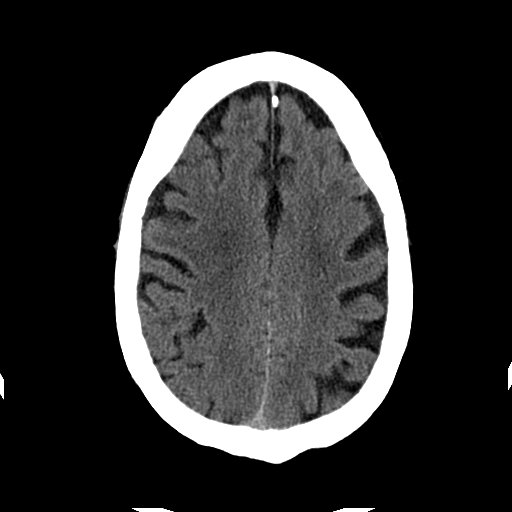
[im 25/33  brain]
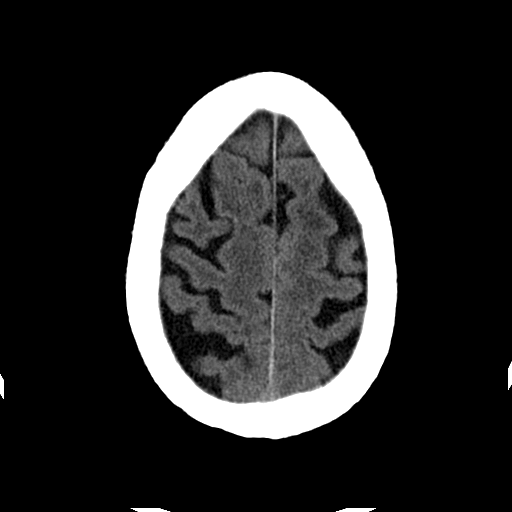
[im 29/33  brain]
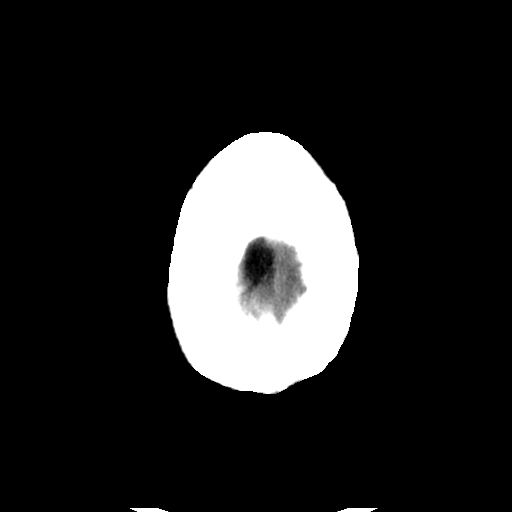

[Series 3: head w/o bone · axial · non-contrast · 0.45mm/px · z∈[+280,+407]mm · 8 of 65 slices shown]
[im 7/65  bone]
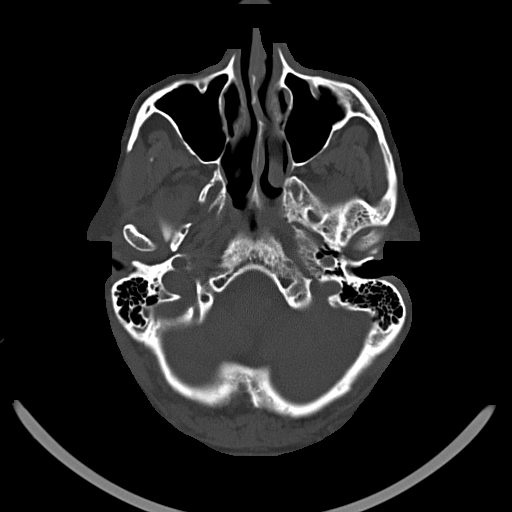
[im 14/65  bone]
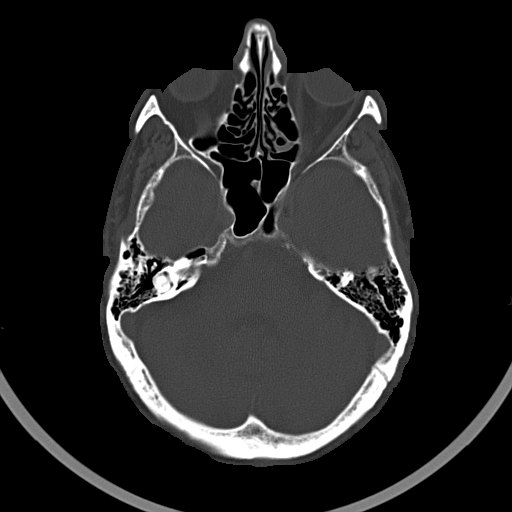
[im 21/65  bone]
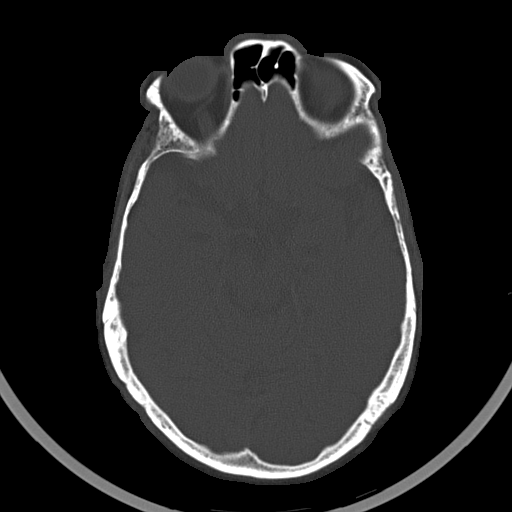
[im 27/65  bone]
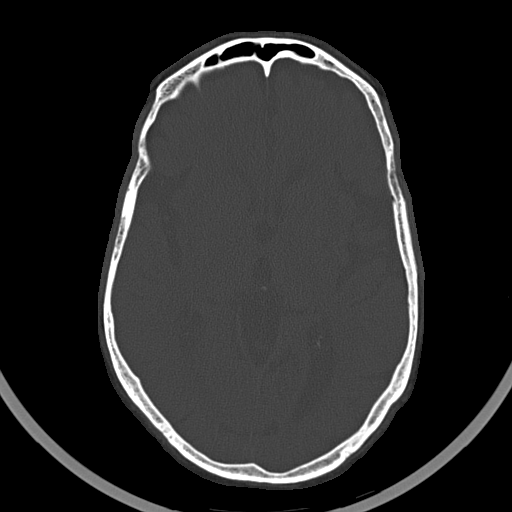
[im 38/65  bone]
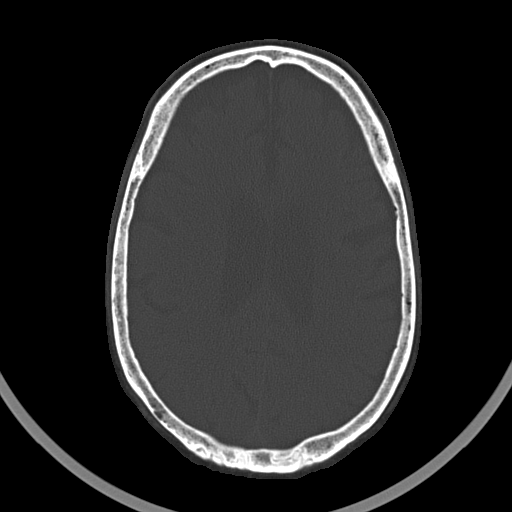
[im 44/65  bone]
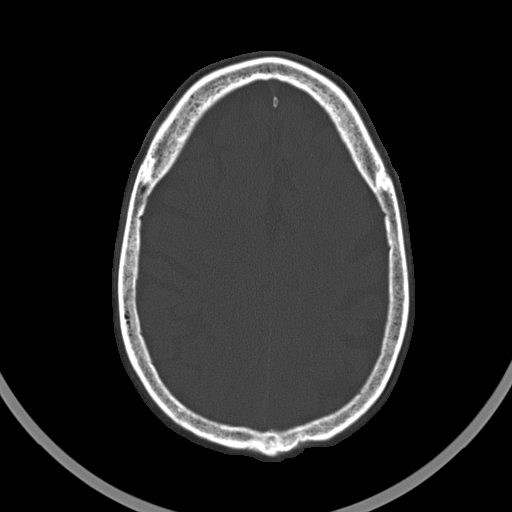
[im 51/65  bone]
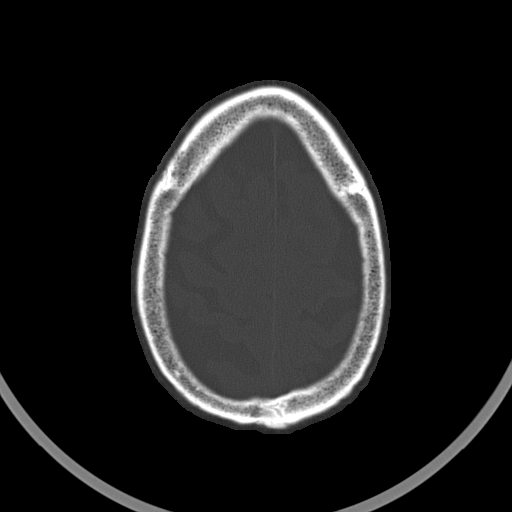
[im 58/65  bone]
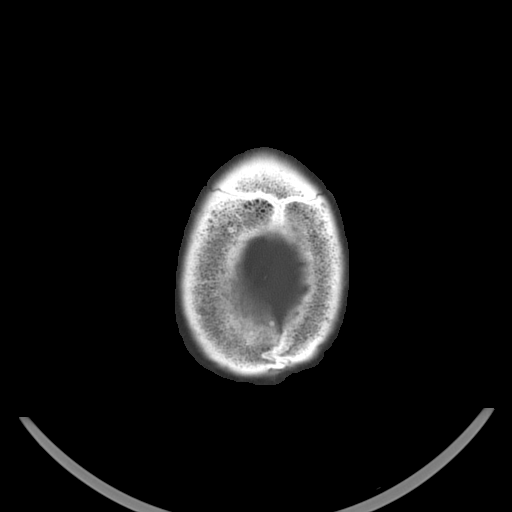

[16 of 30 positions shown; findings below may reference images not displayed]

FINDINGS: Skull and Sinuses:No significant abnormality.

Orbits: No acute abnormality.

Brain: No evidence of acute abnormality, such as acute infarction,
hemorrhage, hydrocephalus, or mass lesion/mass effect. Generalized
brain atrophy. Chronic small vessel disease with stable pattern of
white matter injury (when accounting for differences in imaging
angle). Notable remote white matter perforator infarct affecting the
left corona radiata. Patchy subcortical low-density in the bilateral
frontal lobes, especially on the left.
IMPRESSION: 1. No evidence of acute intracranial disease.
2. Age advanced brain atrophy and chronic small vessel disease.

## 2017-02-08 DIAGNOSIS — R404 Transient alteration of awareness: Secondary | ICD-10-CM | POA: Diagnosis not present

## 2017-03-01 DIAGNOSIS — 419620001 Death: Secondary | SNOMED CT | POA: Diagnosis not present

## 2017-03-01 DEATH — deceased
